# Patient Record
Sex: Female | Born: 1950 | Race: Black or African American | Hispanic: No | Marital: Single | Smoking: Never smoker
Health system: Southern US, Community
[De-identification: ages and names within clinical notes are randomized; demographics above are authoritative.]

## PROBLEM LIST (undated history)

## (undated) DIAGNOSIS — I10 Essential (primary) hypertension: Secondary | ICD-10-CM

## (undated) DIAGNOSIS — C419 Malignant neoplasm of bone and articular cartilage, unspecified: Secondary | ICD-10-CM

## (undated) DIAGNOSIS — E039 Hypothyroidism, unspecified: Secondary | ICD-10-CM

## (undated) HISTORY — PX: NASAL SINUS SURGERY: SHX719

## (undated) HISTORY — PX: ABOVE KNEE LEG AMPUTATION: SUR20

## (undated) HISTORY — DX: Essential (primary) hypertension: I10

## (undated) HISTORY — DX: Malignant neoplasm of bone and articular cartilage, unspecified: C41.9

## (undated) HISTORY — DX: Hypothyroidism, unspecified: E03.9

## (undated) HISTORY — PX: THYROIDECTOMY: SHX17

---

## 2009-07-22 ENCOUNTER — Emergency Department (HOSPITAL_COMMUNITY): Admission: EM | Admit: 2009-07-22 | Discharge: 2009-07-22 | Payer: Self-pay | Admitting: Emergency Medicine

## 2018-05-08 ENCOUNTER — Encounter: Payer: Self-pay | Admitting: "Endocrinology

## 2018-05-08 ENCOUNTER — Other Ambulatory Visit: Payer: Self-pay | Admitting: "Endocrinology

## 2018-05-08 ENCOUNTER — Ambulatory Visit (INDEPENDENT_AMBULATORY_CARE_PROVIDER_SITE_OTHER): Payer: Medicare Other | Admitting: "Endocrinology

## 2018-05-08 VITALS — BP 147/84 | HR 93

## 2018-05-08 DIAGNOSIS — Z85850 Personal history of malignant neoplasm of thyroid: Secondary | ICD-10-CM | POA: Diagnosis not present

## 2018-05-08 DIAGNOSIS — E890 Postprocedural hypothyroidism: Secondary | ICD-10-CM

## 2018-05-08 NOTE — Progress Notes (Signed)
Endocrinology Consult Note                                            05/08/2018, 5:56 PM   Subjective:    Patient ID: Teresa Mcgrath, female    DOB: May 13, 1951, PCP Teresa Mcgrath, Teresa Quarry, MD   Past Medical History:  Diagnosis Date  . Bone cancer ([place])   . Hypertension   . Hypothyroidism    Past Surgical History:  Procedure Laterality Date  . ABOVE KNEE LEG AMPUTATION    . NASAL SINUS SURGERY    . THYROIDECTOMY     Social History   Socioeconomic History  . Marital status: Single    Spouse name: Not on file  . Number of children: Not on file  . Years of education: Not on file  . Highest education level: Not on file  Occupational History  . Not on file  Social Needs  . Financial resource strain: Not on file  . Food insecurity:    Worry: Not on file    Inability: Not on file  . Transportation needs:    Medical: Not on file    Non-medical: Not on file  Tobacco Use  . Smoking status: Never Smoker  . Smokeless tobacco: Never Used  Substance and Sexual Activity  . Alcohol use: Never    Frequency: Never  . Drug use: Never  . Sexual activity: Not on file  Lifestyle  . Physical activity:    Days per week: Not on file    Minutes per session: Not on file  . Stress: Not on file  Relationships  . Social connections:    Talks on phone: Not on file    Gets together: Not on file    Attends religious service: Not on file    Active member of club or organization: Not on file    Attends meetings of clubs or organizations: Not on file    Relationship status: Not on file  Other Topics Concern  . Not on file  Social History Narrative  . Not on file   Outpatient Encounter Medications as of 05/08/2018  Medication Sig  . albuterol (VENTOLIN HFA) 108 (90 Base) MCG/ACT inhaler Inhale into the lungs every 6 (six) hours as needed.  . carboxymethylcellulose (REFRESH PLUS) 0.5 % SOLN 1 drop 2 (two) times daily as needed.  . Multiple Vitamin (MULTIVITAMIN WITH MINERALS) TABS  tablet Take 1 tablet by mouth daily.  . ranitidine (ZANTAC) 150 MG tablet Take 150 mg by mouth at bedtime.  Marland Kitchen amLODipine (NORVASC) 10 MG tablet Take 10 mg by mouth daily.  . Azelastine HCl 0.15 % SOLN Place 2 sprays into both nostrils daily.  . hydrALAZINE (APRESOLINE) 10 MG tablet 3 (three) times daily before meals.  Marland Kitchen KLOR-CON M20 20 MEQ tablet Take 20 mEq by mouth 2 (two) times daily.  Marland Kitchen levothyroxine (SYNTHROID, LEVOTHROID) 112 MCG tablet daily.  Marland Kitchen MUCINEX 600 MG 12 hr tablet as needed.  Marland Kitchen telmisartan-hydrochlorothiazide (MICARDIS HCT) 80-25 MG tablet daily.   No facility-administered encounter medications on file as of 05/08/2018.    ALLERGIES: Allergies  Allergen Reactions  . Amoxicillin-Pot Clavulanate Diarrhea and Swelling    abd pain  . Other Hives and Swelling    Beta blockers Fever/aching  . Montelukast Other (See Comments)    "chest feels raw"  VACCINATION STATUS:  There is no immunization history on file for this patient.  HPI [name] is 67 y.o. female who presents today with a medical history as above. she is being seen in consultation for postsurgical hypothyroidism and history of thyroid cancer requested by Robley Fries, MD.  -According to her report, she was diagnosed with thyroid malignancy in 2015 which required total thyroidectomy.  She does not recall if she has received I-131 thyroid remnant ablation. -The details of her medical records are not available to review today. -She was given various dose of levothyroxine over the years, currently on 112 mcg p.o. every morning.  She reports compliance.  She does not have recent thyroid function tests, TSH from October 2018 was 2.3.  She does not recall if she had subsequent surveillance thyroid/neck ultrasound.  -She denies cold/heat intolerance.  She is progressively gaining weight.  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 -She denies any family history of thyroid malignancy. -She denies dysphagia, odynophagia, nor voice change recently.  Review of Systems  Constitutional: + Recent weight gain,  no fatigue, no subjective hyperthermia, no subjective hypothermia Eyes: no blurry vision, no xerophthalmia ENT: no sore throat, no nodules palpated in throat, no dysphagia/odynophagia, no hoarseness Cardiovascular: no Chest Pain, no Shortness of Breath, no palpitations, no leg swelling Respiratory: no cough, no SOB Gastrointestinal: no Nausea/Vomiting/Diarhhea Musculoskeletal: + Wheelchair-bound, due to left lower extremity amputation for bone malignancy in the remote past.    Skin: no rashes Neurological: no tremors, no numbness, no tingling, no dizziness Psychiatric: no depression, no anxiety  Objective:    BP (!) 147/84   Pulse 93   Wt Readings from Last 3 Encounters:  No data found for Wt    Physical Exam  Constitutional: Sitting her wheelchair, not in acute distress, normal state of mind Eyes: PERRLA, EOMI, no exophthalmos ENT: moist mucous membranes, thyroidectomy scar on anterior lower neck,  no cervical lymphadenopathy Cardiovascular: normal precordial activity, Regular Rate and Rhythm, no Murmur/Rubs/Gallops Respiratory:  adequate breathing efforts, no gross chest deformity, Clear to auscultation bilaterally Gastrointestinal: abdomen soft, Non -tender, No distension, Bowel Sounds present Musculoskeletal: no gross deformities, strength intact in all four extremities Skin: moist, warm, no rashes Neurological: no tremor with outstretched hands, Deep tendon reflexes normal in all four extremities.  -No recent thyroid function test, no recent thyroid/neck sonogram.    Assessment & Plan:   1. History of thyroid cancer 2. Postsurgical hypothyroidism  - Teresa Mcgrath  is being seen at a kind request of Jaff, Teresa Quarry, MD. -Her  previous thyroid cancer diagnosis and treatment records are not available to review today.  -Regarding her postsurgical hypothyroidism, I will send her to lab for a new set of thyroid function tests.  In the meantime, I advised her to continue levothyroxine 112 mcg p.o. every morning.  - We discussed about correct intake of levothyroxine, at fasting, with water, separated by at least 30 minutes from breakfast, and separated by more than 4 hours from calcium, iron, multivitamins, acid reflux medications (PPIs). -Patient is made aware of the fact that thyroid hormone replacement is needed for life, dose to be adjusted by periodic monitoring of thyroid function tests.  -Regarding her history of thyroid malignancy, appears that she did not receive adjuvant thyroid remnant ablation.  She is asked to obtain thyroid/neck  ultrasound to assess the thyroid bed more completely.  She prefers to obtain her imaging study at the local facility in [place]. -Next course of action will depend on ultrasound findings.    - I did not initiate any new prescriptions today.  - I advised her  to maintain close follow up with Jaff, Teresa Quarry, MD for primary care needs.  - Time spent with the patient: 45 minutes, of which >50% was spent in obtaining information about her symptoms, reviewing her previous labs, evaluations, and treatments, counseling her about her postsurgical hypothyroidism, history of thyroid malignancy, and developing a plan to confirm the diagnosis and long term treatment as necessary.  Teresa Mcgrath participated in the discussions, expressed understanding, and voiced agreement with the above plans.  All questions were answered to her satisfaction. she is encouraged to contact clinic should she have any questions or concerns prior to her return visit.  Follow up plan: Return in about 3 weeks (around 05/29/2018) for labs today, Thyroid / Neck Ultrasound.   Glade Lloyd, MD [facility]  [facility] [address] [place], [place] [address] Phone: [phone]  Fax: [phone]     05/08/2018, 5:56 PM  This note was partially dictated with voice recognition software. Similar sounding words can be transcribed inadequately or may not  be corrected upon review.

## 2018-05-09 LAB — T3: T3, Total: 100 ng/dL (ref 71–180)

## 2018-05-09 LAB — TSH: TSH: 0.074 u[IU]/mL — ABNORMAL LOW (ref 0.450–4.500)

## 2018-05-09 LAB — T4, FREE: Free T4: 1.61 ng/dL (ref 0.82–1.77)

## 2018-05-22 ENCOUNTER — Ambulatory Visit (HOSPITAL_COMMUNITY): Admission: RE | Admit: 2018-05-22 | Payer: Medicare Other | Source: Ambulatory Visit

## 2018-05-23 ENCOUNTER — Ambulatory Visit (HOSPITAL_COMMUNITY)
Admission: RE | Admit: 2018-05-23 | Discharge: 2018-05-23 | Disposition: A | Discharge status: Home / Self Care | Payer: Medicare Other | Source: Ambulatory Visit | Attending: "Endocrinology | Admitting: "Endocrinology

## 2018-05-23 DIAGNOSIS — E890 Postprocedural hypothyroidism: Secondary | ICD-10-CM | POA: Insufficient documentation

## 2018-06-01 ENCOUNTER — Encounter: Payer: Self-pay | Admitting: "Endocrinology

## 2018-06-01 ENCOUNTER — Ambulatory Visit (INDEPENDENT_AMBULATORY_CARE_PROVIDER_SITE_OTHER): Payer: Medicare Other | Admitting: "Endocrinology

## 2018-06-01 VITALS — BP 144/82 | HR 80

## 2018-06-01 DIAGNOSIS — Z85850 Personal history of malignant neoplasm of thyroid: Secondary | ICD-10-CM | POA: Diagnosis not present

## 2018-06-01 DIAGNOSIS — E890 Postprocedural hypothyroidism: Secondary | ICD-10-CM

## 2018-06-01 MED ORDER — LEVOTHYROXINE SODIUM 112 MCG PO TABS: 112.0000 ug | ORAL_TABLET | Freq: Every day | ORAL | 2 refills | Status: DC

## 2018-06-01 NOTE — Progress Notes (Signed)
Endocrinology follow-up note                                            06/01/2018, 5:16 PM   Subjective:    Patient ID: Teresa Mcgrath, female    DOB: 02-Oct-1951, PCP Teresa Mcgrath, Teresa Quarry, MD   Past Medical History:  Diagnosis Date  . Bone cancer ([place])   . Hypertension   . Hypothyroidism    Past Surgical History:  Procedure Laterality Date  . ABOVE KNEE LEG AMPUTATION    . NASAL SINUS SURGERY    . THYROIDECTOMY     Social History   Socioeconomic History  . Marital status: Single    Spouse name: Not on file  . Number of children: Not on file  . Years of education: Not on file  . Highest education level: Not on file  Occupational History  . Not on file  Social Needs  . Financial resource strain: Not on file  . Food insecurity:    Worry: Not on file    Inability: Not on file  . Transportation needs:    Medical: Not on file    Non-medical: Not on file  Tobacco Use  . Smoking status: Never Smoker  . Smokeless tobacco: Never Used  Substance and Sexual Activity  . Alcohol use: Never    Frequency: Never  . Drug use: Never  . Sexual activity: Not on file  Lifestyle  . Physical activity:    Days per week: Not on file    Minutes per session: Not on file  . Stress: Not on file  Relationships  . Social connections:    Talks on phone: Not on file    Gets together: Not on file    Attends religious service: Not on file    Active member of club or organization: Not on file    Attends meetings of clubs or organizations: Not on file    Relationship status: Not on file  Other Topics Concern  . Not on file  Social History Narrative  . Not on file   Outpatient Encounter Medications as of 06/01/2018  Medication Sig  . albuterol (VENTOLIN HFA) 108 (90 Base) MCG/ACT inhaler Inhale into the lungs every 6 (six) hours as needed.  Marland Kitchen amLODipine (NORVASC) 10 MG tablet Take 10 mg by mouth daily.  . Azelastine HCl 0.15 % SOLN Place 2 sprays into both nostrils daily.  .  carboxymethylcellulose (REFRESH PLUS) 0.5 % SOLN 1 drop 2 (two) times daily as needed.  . hydrALAZINE (APRESOLINE) 10 MG tablet 3 (three) times daily before meals.  Marland Kitchen KLOR-CON M20 20 MEQ tablet Take 20 mEq by mouth 2 (two) times daily.  Marland Kitchen levothyroxine (SYNTHROID, LEVOTHROID) 112 MCG tablet Take 1 tablet (112 mcg total) by mouth daily before breakfast.  . MUCINEX 600 MG 12 hr tablet as needed.  . Multiple Vitamin (MULTIVITAMIN WITH MINERALS) TABS tablet Take 1 tablet by mouth daily.  . ranitidine (ZANTAC) 150 MG tablet Take 150 mg by mouth at bedtime.  Marland Kitchen telmisartan-hydrochlorothiazide (MICARDIS HCT) 80-25 MG tablet daily.  . [DISCONTINUED] levothyroxine (SYNTHROID, LEVOTHROID) 112 MCG tablet daily.   No facility-administered encounter medications on file as of 06/01/2018.    ALLERGIES: Allergies  Allergen Reactions  . Amoxicillin-Pot Clavulanate Diarrhea and Swelling    abd pain  . Other Hives and Swelling  Beta blockers Fever/aching  . Montelukast Other (See Comments)    "chest feels raw"    VACCINATION STATUS:  There is no immunization history on file for this patient.  HPI Teresa Mcgrath is 67 y.o. female who presents today with a medical history as above. she is being seen in follow-up with thyroid neck ultrasound and thyroid function test after she was seen in consultation for postsurgical hypothyroidism and history of thyroid cancer requested by Teresa Fries, MD.  -According to her report, she was diagnosed with thyroid malignancy in 2015 which required total thyroidectomy.  She does not recall if she has received I-131 thyroid remnant ablation. -The details of her medical records are not available to review today. -She was given various dose of levothyroxine over the years, currently on 112 mcg p.o. every morning.  She reports compliance.   -Her previsit thyroid function tests show appropriately suppressed TSH and free T4 of 1.61.    -She denies cold/heat intolerance.   She is progressively gaining weight, studies since last visit.  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 -She denies any family history of thyroid malignancy. -She denies dysphagia, odynophagia, nor voice change recently.  Review of Systems  Constitutional: + Steady weight,   no fatigue, no subjective hyperthermia, no subjective hypothermia Eyes: no blurry vision, no xerophthalmia ENT: no sore throat, no nodules palpated in throat, no dysphagia/odynophagia, no hoarseness Cardiovascular: no Chest Pain, no Shortness of Breath, no palpitations, no leg swelling Respiratory: no cough, no SOB Gastrointestinal: no Nausea/Vomiting/Diarhhea Musculoskeletal: + Wheelchair-bound, due to left lower extremity amputation for bone malignancy in the remote past.    Skin: no rashes Neurological: no tremors, no numbness, no tingling, no dizziness Psychiatric: no depression, no anxiety  Objective:    BP (!) 144/82   Pulse 80   Wt Readings from Last 3 Encounters:  No data found for Wt    Physical Exam  Constitutional: Sitting in her wheelchair, not in acute distress, normal state of mind Eyes: PERRLA, EOMI, no exophthalmos ENT: moist mucous membranes, thyroidectomy scar on anterior lower neck,  no cervical lymphadenopathy Cardiovascular: normal precordial activity, Regular Rate and Rhythm, no Murmur/Rubs/Gallops Respiratory:  adequate breathing efforts, no gross chest deformity, Clear to auscultation bilaterally Gastrointestinal: abdomen soft, Non -tender, No distension, Bowel Sounds present Musculoskeletal:  + Wheelchair-bound, due to left lower extremity amputation for bone malignancy in the remote past.   Skin: moist, warm, no rashes Neurological: no tremor with outstretched hands  Recent Results (from the past 2160 hour(s))  T4, free     Status: None    Collection Time: 05/08/18  3:16 PM  Result Value Ref Range   Free T4 1.61 0.82 - 1.77 ng/dL  TSH     Status: Abnormal   Collection Time: 05/08/18  3:16 PM  Result Value Ref Range   TSH 0.074 (L) 0.450 - 4.500 uIU/mL  T3     Status: None   Collection Time: 05/08/18  3:16 PM  Result Value Ref Range   T3, Total 100 71 - 180 ng/dL   Thyroid/neck ultrasound from May 23, 2018 reveals surgical changes of prior total thyroidectomy without evidence of residual thyroid tissue, nodules or lymphadenopathy.  Assessment & Plan:   1. History of thyroid cancer 2. Postsurgical hypothyroidism  -Her previous thyroid cancer diagnosis and treatment records are not available  to review today.  Her thyroid function tests are consistent with appropriate suppression of TSH at 0.07, free T4 1.61. -She is advised to continue on her current levothyroxine dose of 112 mcg p.o. daily before breakfast.   - We discussed about correct intake of levothyroxine, at fasting, with water, separated by at least 30 minutes from breakfast, and separated by more than 4 hours from calcium, iron, multivitamins, acid reflux medications (PPIs). -Patient is made aware of the fact that thyroid hormone replacement is needed for life, dose to be adjusted by periodic monitoring of thyroid function tests.   -Regarding her history of thyroid malignancy, appears that she did not receive adjuvant thyroid remnant ablation.  -Her surveillance thyroid/neck ultrasound reveals no residual tissue, no lymphadenopathy.  -She would not be considered for any additional treatment at this time.  She will be considered for unstimulated thyroglobulin and thyroglobulin antibodies during her next previsit labs. -If she would have detectable thyroglobulin, she will be considered for Thyrogen stimulated whole-body scan subsequent to her next visit.   - I advised her  to maintain close follow up with Jaff, Teresa Quarry, MD for primary care needs.  - Time spent  with the patient: 25 min, of which >50% was spent in reviewing her  current and  previous labs, previous treatments, and medications doses and developing a plan for long-term care.  Teresa Mcgrath participated in the discussions, expressed understanding, and voiced agreement with the above plans.  All questions were answered to her satisfaction. she is encouraged to contact clinic should she have any questions or concerns prior to her return visit.   Follow up plan: Return in about 6 months (around 12/02/2018) for follow up with pre-visit labs.   Glade Lloyd, MD [facility] [address] [place], [place] [address] Phone: [phone]  Fax: [phone]     06/01/2018, 5:16 PM  This note was partially dictated with voice recognition software. Similar sounding words can be transcribed inadequately or may not  be corrected upon review.

## 2018-11-27 ENCOUNTER — Other Ambulatory Visit: Payer: Self-pay | Admitting: "Endocrinology

## 2018-11-28 LAB — T4, FREE: FREE T4: 1.96 ng/dL — AB (ref 0.82–1.77)

## 2018-11-28 LAB — TSH: TSH: 0.012 u[IU]/mL — AB (ref 0.450–4.500)

## 2018-11-28 LAB — THYROGLOBULIN ANTIBODY: Thyroglobulin Antibody: <1 IU/mL (ref 0.0–0.9)

## 2018-12-05 ENCOUNTER — Ambulatory Visit: Payer: Medicare Other | Admitting: "Endocrinology

## 2018-12-22 ENCOUNTER — Encounter: Payer: Self-pay | Admitting: "Endocrinology

## 2018-12-22 ENCOUNTER — Ambulatory Visit (INDEPENDENT_AMBULATORY_CARE_PROVIDER_SITE_OTHER): Payer: Medicare Other | Admitting: "Endocrinology

## 2018-12-22 VITALS — BP 135/86 | HR 80 | Wt 181.0 lb

## 2018-12-22 DIAGNOSIS — Z85850 Personal history of malignant neoplasm of thyroid: Secondary | ICD-10-CM | POA: Diagnosis not present

## 2018-12-22 DIAGNOSIS — E890 Postprocedural hypothyroidism: Secondary | ICD-10-CM | POA: Diagnosis not present

## 2018-12-22 MED ORDER — LEVOTHYROXINE SODIUM 100 MCG PO TABS: 100.0000 ug | ORAL_TABLET | Freq: Every day | ORAL | 1 refills | Status: DC

## 2018-12-22 NOTE — Progress Notes (Signed)
Endocrinology follow-up note                                            12/22/2018, 10:20 AM   Subjective:    Patient ID: Teresa Mcgrath, female    DOB: 20-Feb-1951, PCP Jodean Lima, Salli Quarry, MD   Past Medical History:  Diagnosis Date  . Bone cancer ([place])   . Hypertension   . Hypothyroidism    Past Surgical History:  Procedure Laterality Date  . ABOVE KNEE LEG AMPUTATION    . NASAL SINUS SURGERY    . THYROIDECTOMY     Social History   Socioeconomic History  . Marital status: Single    Spouse name: Not on file  . Number of children: Not on file  . Years of education: Not on file  . Highest education level: Not on file  Occupational History  . Not on file  Social Needs  . Financial resource strain: Not on file  . Food insecurity:    Worry: Not on file    Inability: Not on file  . Transportation needs:    Medical: Not on file    Non-medical: Not on file  Tobacco Use  . Smoking status: Never Smoker  . Smokeless tobacco: Never Used  Substance and Sexual Activity  . Alcohol use: Never    Frequency: Never  . Drug use: Never  . Sexual activity: Not on file  Lifestyle  . Physical activity:    Days per week: Not on file    Minutes per session: Not on file  . Stress: Not on file  Relationships  . Social connections:    Talks on phone: Not on file    Gets together: Not on file    Attends religious service: Not on file    Active member of club or organization: Not on file    Attends meetings of clubs or organizations: Not on file    Relationship status: Not on file  Other Topics Concern  . Not on file  Social History Narrative  . Not on file   Outpatient Encounter Medications as of 12/22/2018  Medication Sig  . fluticasone (FLOVENT DISKUS) 50 MCG/BLIST diskus inhaler Inhale 1 puff into the lungs daily.  Marland Kitchen albuterol (VENTOLIN HFA) 108 (90 Base) MCG/ACT inhaler Inhale into the lungs every 6 (six) hours as needed.  Marland Kitchen amLODipine (NORVASC) 10 MG tablet Take 10 mg  by mouth daily.  . carboxymethylcellulose (REFRESH PLUS) 0.5 % SOLN 1 drop 2 (two) times daily as needed.  . hydrALAZINE (APRESOLINE) 10 MG tablet 3 (three) times daily before meals.  Marland Kitchen KLOR-CON M20 20 MEQ tablet Take 20 mEq by mouth 2 (two) times daily.  Marland Kitchen levothyroxine (SYNTHROID, LEVOTHROID) 100 MCG tablet Take 1 tablet (100 mcg total) by mouth daily before breakfast.  . MUCINEX 600 MG 12 hr tablet as needed.  Marland Kitchen telmisartan-hydrochlorothiazide (MICARDIS HCT) 80-25 MG tablet daily.  . [DISCONTINUED] Azelastine HCl 0.15 % SOLN Place 2 sprays into both nostrils daily.  . [DISCONTINUED] levothyroxine (SYNTHROID, LEVOTHROID) 112 MCG tablet Take 1 tablet (112 mcg total) by mouth daily before breakfast.  . [DISCONTINUED] Multiple Vitamin (MULTIVITAMIN WITH MINERALS) TABS tablet Take 1 tablet by mouth daily.  . [DISCONTINUED] ranitidine (ZANTAC) 150 MG tablet Take 150 mg by mouth at bedtime.   No facility-administered encounter medications on file as  of 12/22/2018.    ALLERGIES: Allergies  Allergen Reactions  . Amoxicillin-Pot Clavulanate Diarrhea and Swelling    abd pain  . Other Hives and Swelling    Beta blockers Fever/aching  . Montelukast Other (See Comments)    "chest feels raw"    VACCINATION STATUS:  There is no immunization history on file for this patient.  HPI Teresa Mcgrath is 68 y.o. female who presents today with a medical history as above. 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  Prior to her last visit, thyroid/ neck ultrasound was negative for any residual tissue nor lymphadenopathy.    -According to her report, she was diagnosed with thyroid malignancy in 2015 which required total thyroidectomy.  She does not recall if she has received I-131 thyroid remnant ablation. -The details of her medical records  are not available to review today. -She was given various dose of levothyroxine over the years, currently on 112 mcg p.o. every morning.  She reports compliance.   -Her previsit thyroid function tests show slight over replacement.     -She denies cold/heat intolerance.  She is progressively gaining weight, studies since last visit.  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 -She denies any family history of thyroid malignancy. -She denies dysphagia, odynophagia, nor voice change recently.  Review of Systems  Constitutional: + Steady weight,   no fatigue, no subjective hyperthermia, no subjective hypothermia Eyes: no blurry vision, no xerophthalmia ENT: no sore throat, no nodules palpated in throat, no dysphagia/odynophagia, no hoarseness Cardiovascular: no Chest Pain, no Shortness of Breath, no palpitations, no leg swelling Respiratory: no cough, no SOB Gastrointestinal: no Nausea/Vomiting/Diarhhea Musculoskeletal: + Wheelchair-bound, due to left lower extremity amputation for bone malignancy in the remote past.    Skin: no rashes Neurological: no tremors, no numbness, no tingling, no dizziness Psychiatric: no depression, no anxiety  Objective:    BP 135/86   Pulse 80   Wt 181 lb (82.1 kg)   Wt Readings from Last 3 Encounters:  12/22/18 181 lb (82.1 kg)    Physical Exam  Constitutional: Sitting in her wheelchair, not in acute distress, normal state of mind Eyes: PERRLA, EOMI, no exophthalmos ENT: moist mucous membranes, thyroidectomy scar on anterior lower neck,  no cervical lymphadenopathy Musculoskeletal:  + Wheelchair-bound, due to left lower extremity amputation for bone malignancy in the remote past.   Skin: moist, warm, no rashes Neurological: no tremor with outstretched hands  Recent Results (from the past 2160 hour(s))  T4,  free     Status: Abnormal   Collection Time: 11/27/18  1:52 PM  Result Value Ref Range   Free T4 1.96 (H) 0.82 - 1.77 ng/dL  TSH     Status: Abnormal   Collection Time: 11/27/18  1:52 PM  Result Value Ref Range   TSH 0.012 (L) 0.450 - 4.500 uIU/mL  Thyroglobulin antibody     Status: None   Collection Time: 11/27/18  1:52 PM  Result Value Ref Range   Thyroglobulin Antibody <1.0 0.0 - 0.9 IU/mL    Comment: Thyroglobulin Antibody measured by [facility] Method[employer]   Thyroid/neck ultrasound from May 23, 2018 reveals surgical changes of prior total thyroidectomy without evidence of residual thyroid tissue,  nodules or lymphadenopathy.  Assessment & Plan:   1. History of thyroid cancer 2. Postsurgical hypothyroidism  -Her previous thyroid cancer diagnosis and treatment records are not available to review today.  Her thyroid function tests are consistent with over replacement beyond the required the required suppression of TSH.    -She is approached for a lower dose of levothyroxine.  I discussed and lowered her levothyroxine to 100 mcg p.o. every morning.   - We discussed about the correct intake of her thyroid hormone, on empty stomach at fasting, with water, separated by at least 30 minutes from breakfast and other medications,  and separated by more than 4 hours from calcium, iron, multivitamins, acid reflux medications (PPIs). -Patient is made aware of the fact that thyroid hormone replacement is needed for life, dose to be adjusted by periodic monitoring of thyroid function tests.    -Regarding her history of thyroid malignancy, appears that she did not receive adjuvant thyroid remnant ablation.  -Her surveillance thyroid/neck ultrasound reveals no residual tissue, no lymphadenopathy.  -She would not be considered for any additional treatment at this time.  She will be considered for unstimulated thyroglobulin and thyroglobulin antibodies during her next previsit labs. -If she  would have detectable thyroglobulin, she will be considered for Thyrogen stimulated whole-body scan subsequent to her next visit.   - I advised her  to maintain close follow up with Jaff, Salli Quarry, MD for primary care needs.   Follow up plan: No follow-ups on file.   Glade Lloyd, MD [facility] [address] [place], [place] [address] Phone: [phone]  Fax: [phone]     12/22/2018, 10:20 AM  This note was partially dictated with voice recognition software. Similar sounding words can be transcribed inadequately or may not  be corrected upon review.

## 2019-01-15 ENCOUNTER — Other Ambulatory Visit: Payer: Self-pay | Admitting: "Endocrinology

## 2019-01-15 ENCOUNTER — Telehealth: Payer: Self-pay | Admitting: "Endocrinology

## 2019-01-15 MED ORDER — LEVOTHYROXINE SODIUM 88 MCG PO TABS: 88.0000 ug | ORAL_TABLET | Freq: Every day | ORAL | 2 refills | Status: DC

## 2019-01-15 NOTE — Telephone Encounter (Signed)
Please advise 

## 2019-01-15 NOTE — Telephone Encounter (Signed)
Pt LVM that the levothyroxine (SYNTHROID, LEVOTHROID) 100 MCG tablet is causing her to be jittery\headache\itching  Please call [phone]

## 2019-01-15 NOTE — Telephone Encounter (Signed)
I will lower her dose to 88 mcg , please inform her to pick up from pharmacy. Thank you.

## 2019-01-16 NOTE — Telephone Encounter (Signed)
Spoke with patient and advised her of dose change to medication with verbal understanding.

## 2019-01-16 NOTE — Telephone Encounter (Signed)
Called patient to let her know her med will be decreased. No answer and unable to leave vm. Will try again later.

## 2019-02-25 ENCOUNTER — Other Ambulatory Visit: Payer: Self-pay | Admitting: "Endocrinology

## 2019-02-26 NOTE — Telephone Encounter (Signed)
Which dose of levothyroxine should pt be on?

## 2019-03-05 IMAGING — US US THYROID
1 series · 14 of 19 positions shown · non-contrast
Comparison: None.

CLINICAL DATA: Other. 66-year-old female status post total
thyroidectomy.

EXAM:
THYROID ULTRASOUND
TECHNIQUE: Ultrasound examination of the thyroid gland and adjacent soft
tissues was performed.

[Series 1: us thyroid · 0.07mm/px · 14 of 19 slices shown]
[im 1/19]
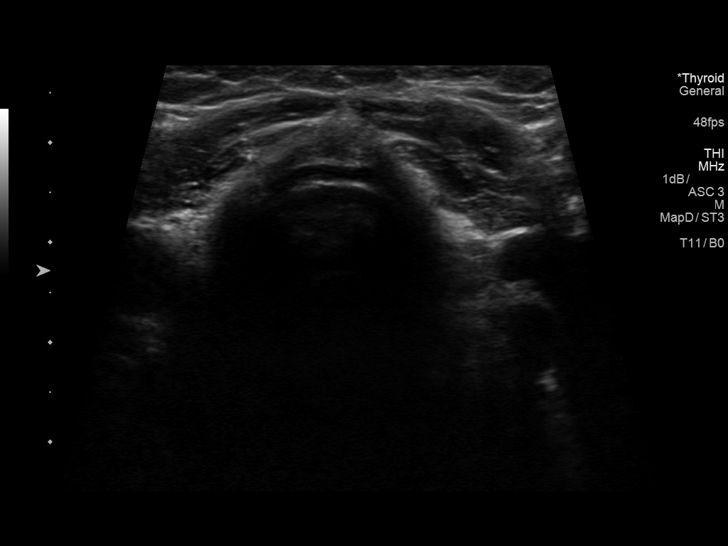
[im 3/19]
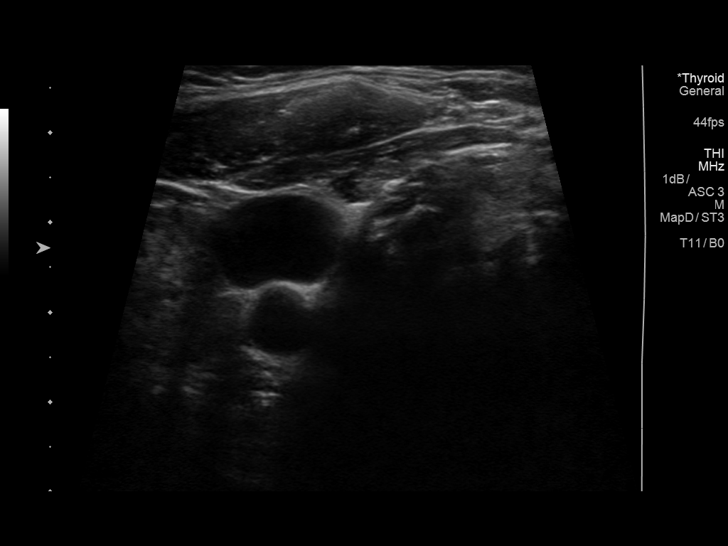
[im 4/19]
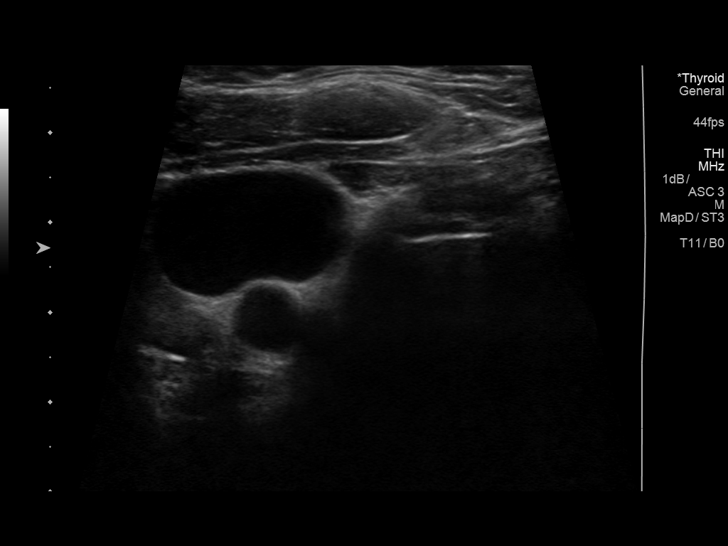
[im 5/19]
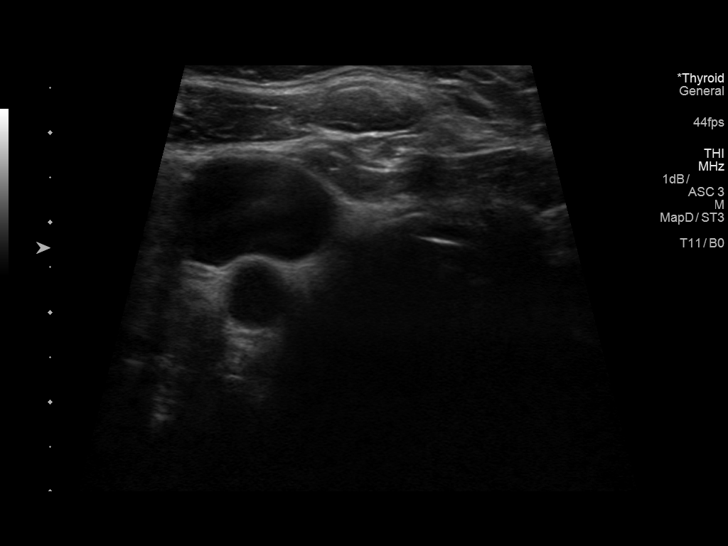
[im 7/19]
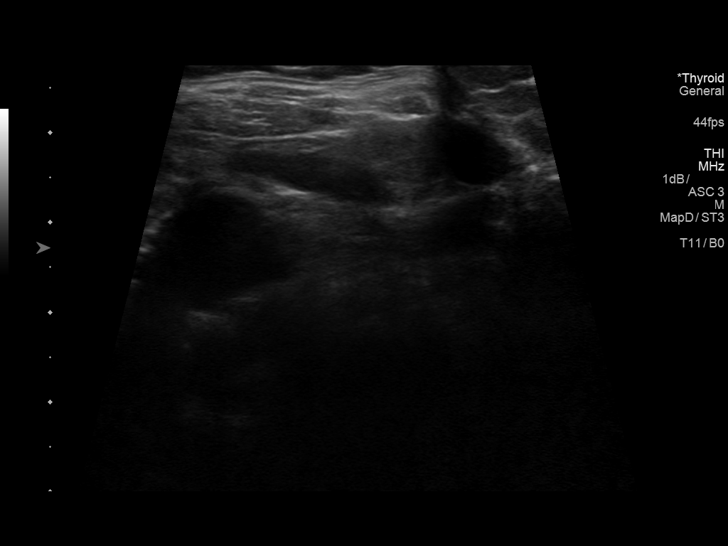
[im 8/19]
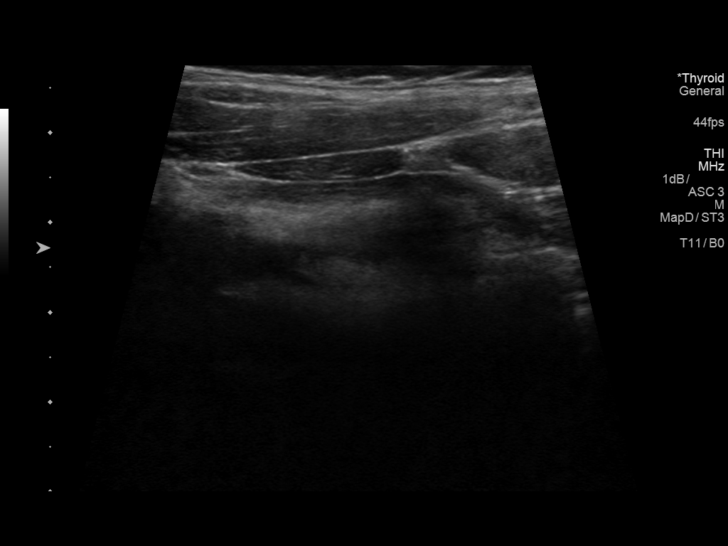
[im 9/19]
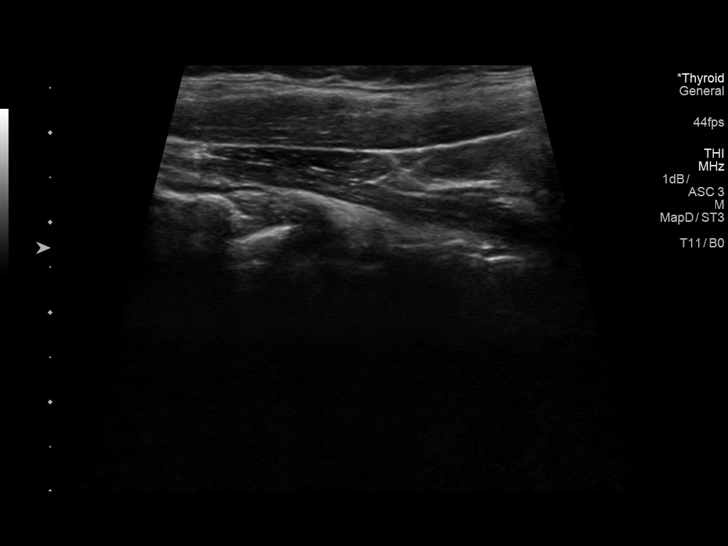
[im 11/19]
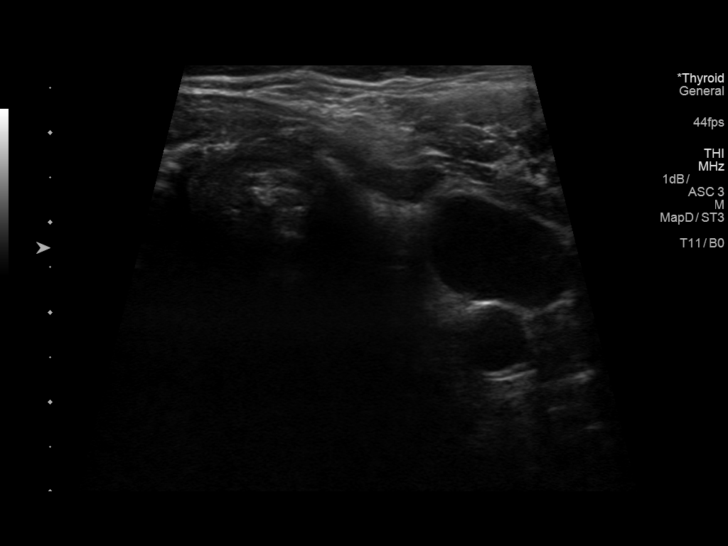
[im 12/19]
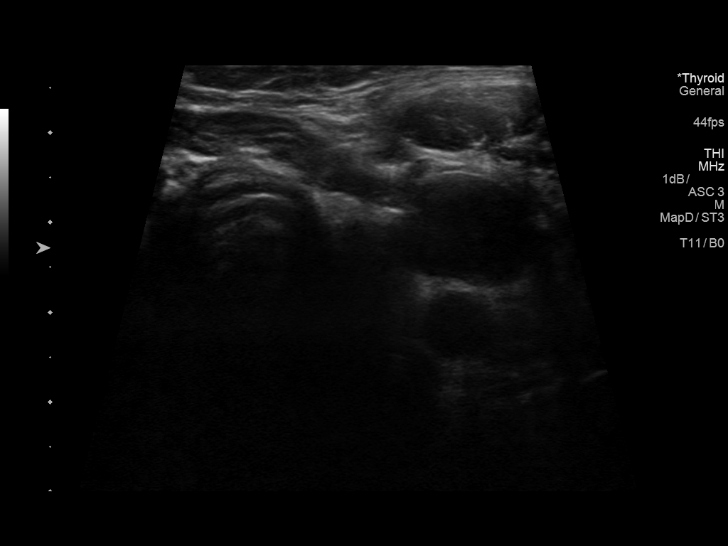
[im 13/19]
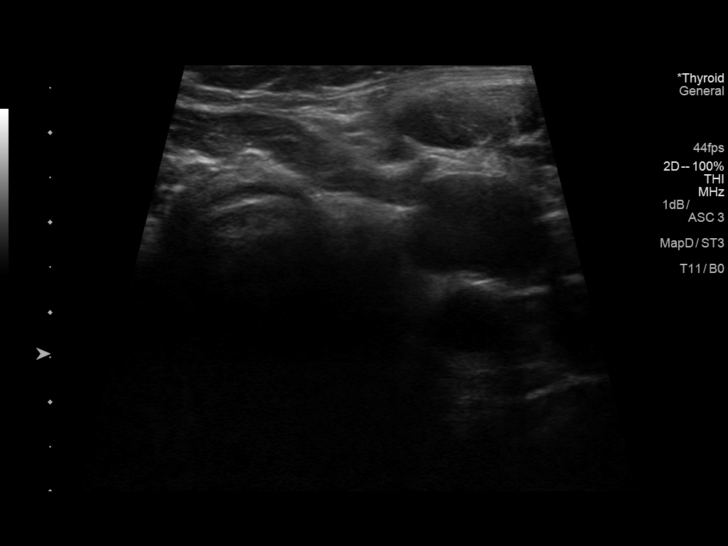
[im 15/19]
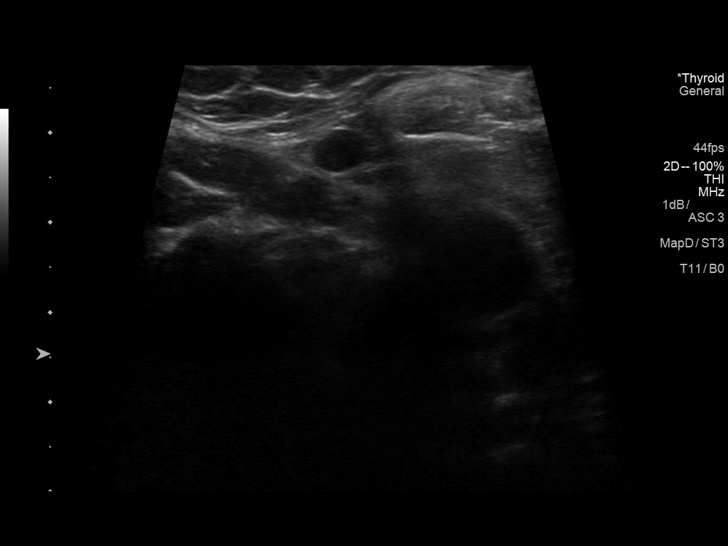
[im 16/19]
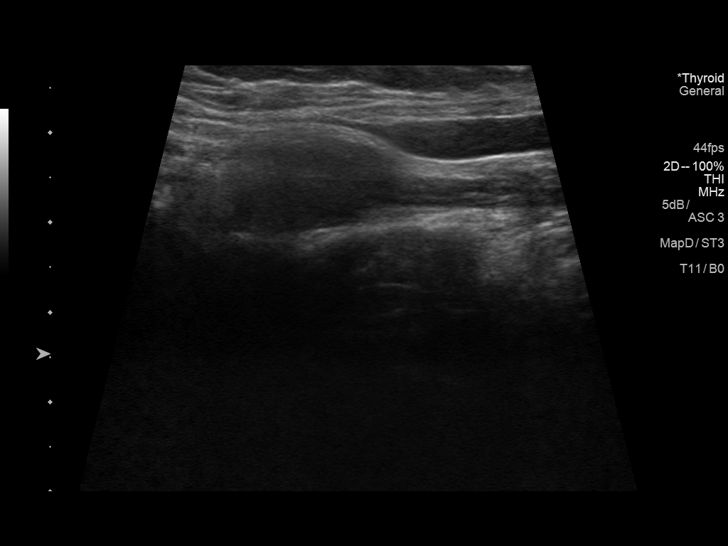
[im 17/19]
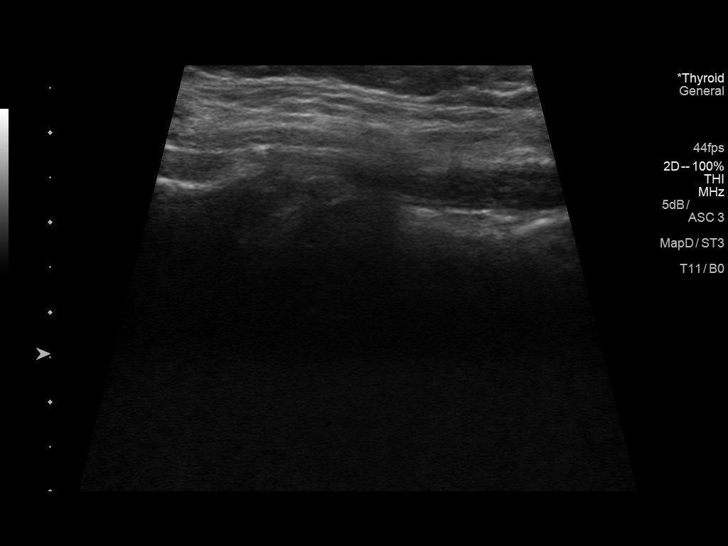
[im 19/19]
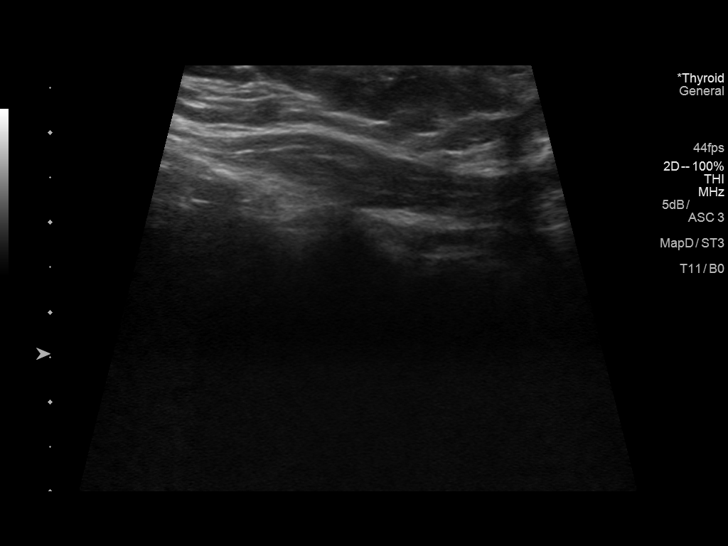

[14 of 19 positions shown; findings below may reference images not displayed]

FINDINGS: The thyroid gland is surgically absent. No evidence of residual
thyroid tissue in the resection bed. No nodularity or
lymphadenopathy. Normal sonographic appearance of the strap muscles,
subcutaneous soft tissues and limited evaluation of the bilateral
carotid arteries and jugular veins.
IMPRESSION: Surgical changes of prior total thyroidectomy without evidence of
residual thyroid tissue, nodules or lymphadenopathy.

## 2019-04-06 ENCOUNTER — Other Ambulatory Visit: Payer: Self-pay | Admitting: "Endocrinology

## 2019-07-10 ENCOUNTER — Telehealth: Payer: Self-pay | Admitting: "Endocrinology

## 2019-07-10 DIAGNOSIS — Z85850 Personal history of malignant neoplasm of thyroid: Secondary | ICD-10-CM

## 2019-07-10 DIAGNOSIS — E890 Postprocedural hypothyroidism: Secondary | ICD-10-CM

## 2019-07-10 NOTE — Telephone Encounter (Signed)
Order faxed to [place] [facility]

## 2019-07-10 NOTE — Telephone Encounter (Signed)
Can you update the labs? She needs them faxed to [facility] in [place]. The one on [address] closed. So not sure which one we need to send it too?

## 2019-07-18 LAB — T4, FREE: Free T4: 1.46 ng/dL (ref 0.82–1.77)

## 2019-07-24 ENCOUNTER — Encounter: Payer: Self-pay | Admitting: "Endocrinology

## 2019-07-24 ENCOUNTER — Ambulatory Visit (INDEPENDENT_AMBULATORY_CARE_PROVIDER_SITE_OTHER): Payer: Medicare Other | Admitting: "Endocrinology

## 2019-07-24 ENCOUNTER — Other Ambulatory Visit: Payer: Self-pay

## 2019-07-24 DIAGNOSIS — E890 Postprocedural hypothyroidism: Secondary | ICD-10-CM

## 2019-07-24 DIAGNOSIS — Z85850 Personal history of malignant neoplasm of thyroid: Secondary | ICD-10-CM

## 2019-07-24 MED ORDER — LEVOTHYROXINE SODIUM 100 MCG PO TABS: 100.0000 ug | ORAL_TABLET | Freq: Every day | ORAL | 1 refills | Status: DC

## 2019-07-24 NOTE — Progress Notes (Signed)
07/24/2019, 11:54 AM                                Endocrinology Telehealth Visit Follow up Note -During COVID -19 Pandemic  I connected with Teresa Mcgrath on [id number]   by telephone and verified that I am speaking with the correct person using two identifiers. Teresa Mcgrath, [id number]. Teresa Mcgrath has verbally consented to this visit. All issues noted in this document were discussed and addressed. The format was not optimal for physical exam.   Subjective:    Patient ID: Teresa Mcgrath, female    DOB: [birth year], PCP Jodean Lima, Salli Quarry, MD   Past Medical History:  Diagnosis Date  . Bone cancer ([place])   . Hypertension   . Hypothyroidism    Past Surgical History:  Procedure Laterality Date  . ABOVE KNEE LEG AMPUTATION    . NASAL SINUS SURGERY    . THYROIDECTOMY     Social History   Socioeconomic History  . Marital status: Single    Spouse name: Not on file  . Number of children: Not on file  . Years of education: Not on file  . Highest education level: Not on file  Occupational History  . Not on file  Social Needs  . Financial resource strain: Not on file  . Food insecurity    Worry: Not on file    Inability: Not on file  . Transportation needs    Medical: Not on file    Non-medical: Not on file  Tobacco Use  . Smoking status: Never Smoker  . Smokeless tobacco: Never Used  Substance and Sexual Activity  . Alcohol use: Never    Frequency: Never  . Drug use: Never  . Sexual activity: Not on file  Lifestyle  . Physical activity    Days per week: Not on file    Minutes per session: Not on file  . Stress: Not on file  Relationships  . Social Herbalist on phone: Not on file    Gets together: Not on file    Attends religious service: Not on file    Active member of club or organization: Not on file    Attends meetings of clubs or organizations: Not on file    Relationship status: Not on file  Other  Topics Concern  . Not on file  Social History Narrative  . Not on file   Outpatient Encounter Medications as of 07/24/2019  Medication Sig  . albuterol (VENTOLIN HFA) 108 (90 Base) MCG/ACT inhaler Inhale into the lungs every 6 (six) hours as needed.  Marland Kitchen amLODipine (NORVASC) 10 MG tablet Take 10 mg by mouth daily.  . carboxymethylcellulose (REFRESH PLUS) 0.5 % SOLN 1 drop 2 (two) times daily as needed.  . fluticasone (FLOVENT DISKUS) 50 MCG/BLIST diskus inhaler Inhale 1 puff into the lungs daily.  . hydrALAZINE (APRESOLINE) 10 MG tablet 3 (three) times daily before meals.  Marland Kitchen KLOR-CON M20 20 MEQ tablet Take 20 mEq by mouth 2 (two) times daily.  Marland Kitchen levothyroxine (SYNTHROID) 100 MCG tablet Take 1 tablet (100 mcg total) by mouth daily before breakfast.  .  MUCINEX 600 MG 12 hr tablet as needed.  Marland Kitchen telmisartan-hydrochlorothiazide (MICARDIS HCT) 80-25 MG tablet daily.  . [DISCONTINUED] levothyroxine (SYNTHROID) 88 MCG tablet TAKE 1 TABLET (88 MCG TOTAL) BY MOUTH DAILY BEFORE BREAKFAST.  . [DISCONTINUED] levothyroxine (SYNTHROID, LEVOTHROID) 100 MCG tablet Take 1 tablet (100 mcg total) by mouth daily before breakfast.   No facility-administered encounter medications on file as of 07/24/2019.    ALLERGIES: Allergies  Allergen Reactions  . Amoxicillin-Pot Clavulanate Diarrhea and Swelling    abd pain  . Other Hives and Swelling    Beta blockers Fever/aching  . Montelukast Other (See Comments)    "chest feels raw"    VACCINATION STATUS:  There is no immunization history on file for this patient.  HPI Teresa Mcgrath is 68 y.o. female who presents today with a medical history as above. Teresa Mcgrath is being engaged in telehealth via telephone in follow-up for her postsurgical hypothyroidism.    Teresa Mcgrath was seen in consultation for  postsurgical hypothyroidism for history of thyroid malignancy treated in 2015 with total thyroidectomy.  Thyroid/neck ultrasound on July 2019 was negative for any residual  thyroid tissue no lymphadenopathy.     -According to her report, Teresa Mcgrath was diagnosed with thyroid malignancy in 2015 which required total thyroidectomy.  Teresa Mcgrath does not recall if Teresa Mcgrath has received I-131 thyroid remnant ablation. -The details of her medical records are not available to review today. -Teresa Mcgrath was given various dose of levothyroxine over the years, currently on 100 mcg p.o. every morning.  Teresa Mcgrath reports compliance.    -Teresa Mcgrath denies cold/heat intolerance.  Teresa Mcgrath is progressively gaining weight, studies since last visit.  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 -Teresa Mcgrath denies any family history of thyroid malignancy. -Teresa Mcgrath denies dysphagia, odynophagia, nor voice change recently.  Review of Systems Limited as above.  Objective:    There were no vitals taken for this visit.  Wt Readings from Last 3 Encounters:  12/22/18 181 lb (82.1 kg)    Physical Exam  Musculoskeletal:  + Wheelchair-bound, due to left lower extremity amputation for bone malignancy in the remote past.    Recent Results (from the past 2160 hour(s))  T4, Free     Status: None   Collection Time: 07/17/19 12:08 PM  Result Value Ref Range   Free T4 1.46 0.82 - 1.77 ng/dL   Thyroid/neck ultrasound from May 23, 2018 reveals surgical changes of prior total thyroidectomy without evidence of residual thyroid tissue, nodules or lymphadenopathy.  Assessment & Plan:   1. History of thyroid cancer 2. Postsurgical hypothyroidism  -Her previous thyroid cancer diagnosis and treatment records are not available to review today.  Her thyroid function tests are consistent with appropriate replacement with levothyroxine.  Her previsit labs were supposed to include thyroglobulin levels and thyroglobulin bodies, however this part of her tests were not performed.   Teresa Mcgrath is advised to continue  levothyroxine 100 mcg p.o. daily before breakfast.    - We discussed about the correct intake of her thyroid hormone, on empty stomach at fasting, with water, separated by at least 30 minutes from breakfast and other medications,  and separated by more than 4 hours from calcium, iron, multivitamins, acid reflux medications (PPIs). -Patient is made aware of the fact that thyroid hormone replacement is needed for life, dose to be adjusted by periodic monitoring of thyroid function  tests.    -Regarding her history of thyroid malignancy, appears that Teresa Mcgrath did not receive adjuvant thyroid remnant ablation.  -Her recent surveillance thyroid/neck ultrasound reveals no residual tissue, no lymphadenopathy.  -Teresa Mcgrath would not be considered for any additional treatment at this time.  Teresa Mcgrath will be considered for unstimulated thyroglobulin and thyroglobulin antibodies during her next previsit labs. -If Teresa Mcgrath is found to have detectable thyroglobulin, Teresa Mcgrath will be considered for Thyrogen stimulated whole-body scan subsequent to her next visit.   - I advised her  to maintain close follow up with Jaff, Salli Quarry, MD for primary care needs.   Time for this visit: 15 minutes. Teresa Mcgrath  participated in the discussions, expressed understanding, and voiced agreement with the above plans.  All questions were answered to her satisfaction. Teresa Mcgrath is encouraged to contact clinic should Teresa Mcgrath have any questions or concerns prior to her return visit.  Follow up plan: No follow-ups on file.   Glade Lloyd, MD [facility] [address] [place],  [address] Phone: [phone]  Fax: [phone]     07/24/2019, 11:54 AM  This note was partially dictated with voice recognition software. Similar sounding words can be transcribed inadequately or may not  be corrected upon review.

## 2020-01-18 ENCOUNTER — Encounter: Payer: Self-pay | Admitting: "Endocrinology

## 2020-01-18 LAB — TSH: TSH: 0.27 — AB (ref 0.41–5.90)

## 2020-01-22 ENCOUNTER — Telehealth: Payer: Self-pay | Admitting: "Endocrinology

## 2020-01-22 NOTE — Telephone Encounter (Signed)
We can see her.

## 2020-01-22 NOTE — Telephone Encounter (Signed)
Patient only did her free T4 and TSH, she did not do the other two. She is on schedule for tomorrow. Do you want me to keep her on or r/s her?

## 2020-01-23 ENCOUNTER — Ambulatory Visit: Payer: Medicare Other | Admitting: "Endocrinology

## 2020-01-25 ENCOUNTER — Ambulatory Visit (INDEPENDENT_AMBULATORY_CARE_PROVIDER_SITE_OTHER): Payer: Medicare Other | Admitting: "Endocrinology

## 2020-01-25 ENCOUNTER — Encounter: Payer: Self-pay | Admitting: "Endocrinology

## 2020-01-25 DIAGNOSIS — Z85850 Personal history of malignant neoplasm of thyroid: Secondary | ICD-10-CM | POA: Diagnosis not present

## 2020-01-25 DIAGNOSIS — E890 Postprocedural hypothyroidism: Secondary | ICD-10-CM

## 2020-01-25 NOTE — Progress Notes (Signed)
01/25/2020, 12:32 PM                                Endocrinology Telehealth Visit Follow up Note -During COVID -19 Pandemic  I connected with Teresa Mcgrath on [id number]   by telephone and verified that I am speaking with the correct person using two identifiers. Teresa Mcgrath, [id number]. she has verbally consented to this visit. All issues noted in this document were discussed and addressed. The format was not optimal for physical exam.   Subjective:    Patient ID: Teresa Mcgrath, female    DOB: [birth year], PCP Jodean Lima, Salli Quarry, MD   Past Medical History:  Diagnosis Date  . Bone cancer ([place])   . Hypertension   . Hypothyroidism    Past Surgical History:  Procedure Laterality Date  . ABOVE KNEE LEG AMPUTATION    . NASAL SINUS SURGERY    . THYROIDECTOMY     Social History   Socioeconomic History  . Marital status: Single    Spouse name: Not on file  . Number of children: Not on file  . Years of education: Not on file  . Highest education level: Not on file  Occupational History  . Not on file  Tobacco Use  . Smoking status: Never Smoker  . Smokeless tobacco: Never Used  Substance and Sexual Activity  . Alcohol use: Never  . Drug use: Never  . Sexual activity: Not on file  Other Topics Concern  . Not on file  Social History Narrative  . Not on file   Social Determinants of Health   Financial Resource Strain:   . Difficulty of Paying Living Expenses:   Food Insecurity:   . Worried About Charity fundraiser in the Last Year:   . Arboriculturist in the Last Year:   Transportation Needs:   . Film/video editor (Medical):   Marland Kitchen Lack of Transportation (Non-Medical):   Physical Activity:   . Days of Exercise per Week:   . Minutes of Exercise per Session:   Stress:   . Feeling of Stress :   Social Connections:   . Frequency of Communication with Friends and Family:   . Frequency of Social Gatherings  with Friends and Family:   . Attends Religious Services:   . Active Member of Clubs or Organizations:   . Attends Archivist Meetings:   Marland Kitchen Marital Status:    Outpatient Encounter Medications as of 01/25/2020  Medication Sig  . albuterol (VENTOLIN HFA) 108 (90 Base) MCG/ACT inhaler Inhale into the lungs every 6 (six) hours as needed.  Marland Kitchen amLODipine (NORVASC) 10 MG tablet Take 10 mg by mouth daily.  . carboxymethylcellulose (REFRESH PLUS) 0.5 % SOLN 1 drop 2 (two) times daily as needed.  . fluticasone (FLOVENT DISKUS) 50 MCG/BLIST diskus inhaler Inhale 1 puff into the lungs daily.  . hydrALAZINE (APRESOLINE) 10 MG tablet 3 (three) times daily before meals.  Marland Kitchen KLOR-CON M20 20 MEQ tablet Take 20 mEq by mouth 2 (two) times daily.  Marland Kitchen levothyroxine (SYNTHROID) 100 MCG tablet Take 1 tablet (100 mcg total) by mouth daily before  breakfast.  . MUCINEX 600 MG 12 hr tablet as needed.  Marland Kitchen telmisartan-hydrochlorothiazide (MICARDIS HCT) 80-25 MG tablet daily.   No facility-administered encounter medications on file as of 01/25/2020.   ALLERGIES: Allergies  Allergen Reactions  . Amoxicillin-Pot Clavulanate Diarrhea and Swelling    abd pain  . Other Hives and Swelling    Beta blockers Fever/aching  . Montelukast Other (See Comments)    "chest feels raw"    VACCINATION STATUS:  There is no immunization history on file for this patient.  HPI Teresa Mcgrath is 69 y.o. female who presents today with a medical history as above. 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  Thyroid/neck ultrasound on July 2019 was negative for any residual thyroid tissue no lymphadenopathy.     -According to her report, she was diagnosed with thyroid malignancy in 2015 which required total thyroidectomy.   She does not recall if she has received I-131 thyroid remnant ablation. -The details of her medical records are not available to review today. -She was given various dose of levothyroxine over the years, currently on 100 mcg p.o. every morning.  She reports compliance.    -She denies cold/heat intolerance.  She is progressively gaining weight, seady since last visit.  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 -She denies any family history of thyroid malignancy. -She denies dysphagia, odynophagia, nor voice change recently.  Review of Systems Limited as above.  Objective:    There were no vitals taken for this visit.  Wt Readings from Last 3 Encounters:  12/22/18 181 lb (82.1 kg)    Physical Exam  Musculoskeletal:  + Wheelchair-bound, due to left lower extremity amputation for bone malignancy in the remote past.    Recent Results (from the past 2160 hour(s))  TSH     Status: Abnormal   Collection Time: 01/18/20 12:00 AM  Result Value Ref Range   TSH 0.27 (A) 0.41 - 5.90    Comment: Free T4 1.28   Thyroid/neck ultrasound from May 23, 2018 reveals surgical changes of prior total thyroidectomy without evidence of residual thyroid tissue, nodules or lymphadenopathy.     Assessment & Plan:   1. History of thyroid cancer 2. Postsurgical hypothyroidism  -Her previous thyroid cancer diagnosis and treatment records are not available to review today.  Her thyroid function tests are consistent with appropriate replacement with levothyroxine.   She is advised to continue levothyroxine 100 mcg p.o. daily before breakfast.    - We discussed about the correct intake of her thyroid hormone, on empty stomach at fasting, with water, separated by at least 30 minutes from breakfast and other medications,  and separated by more than 4 hours from  calcium, iron, multivitamins, acid reflux medications (PPIs). -Patient is made aware of the fact that thyroid hormone replacement is needed for life, dose to be adjusted by periodic monitoring of thyroid function tests.    -Regarding her history of thyroid malignancy, appears that she did not receive adjuvant thyroid remnant ablation.  -Her recent surveillance thyroid/neck ultrasound reveals no residual tissue, no lymphadenopathy.  She will get another surveillance thyroid ultrasound, unstimulated thyroglobulin and thyroglobulin antibodies measurements before her next visit in  4 months.   -If she is found to have detectable thyroglobulin, she will be considered for Thyrogen stimulated whole-body scan subsequent to her next visit.   - I advised her  to maintain close follow up with Jaff, Salli Quarry, MD for primary care needs.     - Time spent on this patient care encounter:  25 minutes of which 50% was spent in  counseling and the rest reviewing  her current and  previous labs / studies and medications  doses and developing a plan for long term care. Teresa Mcgrath  participated in the discussions, expressed understanding, and voiced agreement with the above plans.  All questions were answered to her satisfaction. she is encouraged to contact clinic should she have any questions or concerns prior to her return visit.  Follow up plan: Return in about 4 months (around 05/26/2020) for Thyroid / Neck Ultrasound.   Glade Lloyd, MD [facility] [address] [place], [place] [address] Phone: [phone]  Fax: [phone]     01/25/2020, 12:32 PM  This note was partially dictated with voice recognition software. Similar sounding words can be transcribed inadequately or may not  be corrected upon review.

## 2020-02-05 ENCOUNTER — Other Ambulatory Visit: Payer: Self-pay | Admitting: "Endocrinology

## 2020-05-22 ENCOUNTER — Other Ambulatory Visit: Payer: Self-pay

## 2020-05-22 ENCOUNTER — Ambulatory Visit (HOSPITAL_COMMUNITY)
Admission: RE | Admit: 2020-05-22 | Discharge: 2020-05-22 | Disposition: A | Discharge status: Home / Self Care | Payer: Medicare Other | Source: Ambulatory Visit | Attending: "Endocrinology | Admitting: "Endocrinology

## 2020-05-22 ENCOUNTER — Other Ambulatory Visit: Payer: Self-pay | Admitting: "Endocrinology

## 2020-05-22 DIAGNOSIS — Z85850 Personal history of malignant neoplasm of thyroid: Secondary | ICD-10-CM

## 2020-05-22 LAB — TSH: TSH: 0.21 — AB (ref 0.41–5.90)

## 2020-05-23 LAB — SPECIMEN STATUS REPORT

## 2020-05-24 LAB — TSH: TSH: 0.205 u[IU]/mL — ABNORMAL LOW (ref 0.450–4.500)

## 2020-05-24 LAB — T4, FREE: Free T4: 1.76 ng/dL (ref 0.82–1.77)

## 2020-05-24 LAB — TGAB+THYROGLOBULIN IMA OR LCMS: Thyroglobulin Antibody: <1 IU/mL (ref 0.0–0.9)

## 2020-05-24 LAB — THYROGLOBULIN BY IMA: Thyroglobulin by IMA: <0.1 ng/mL — ABNORMAL LOW (ref 1.5–38.5)

## 2020-05-27 ENCOUNTER — Encounter: Payer: Self-pay | Admitting: "Endocrinology

## 2020-05-27 ENCOUNTER — Ambulatory Visit (INDEPENDENT_AMBULATORY_CARE_PROVIDER_SITE_OTHER): Payer: Medicare Other | Admitting: "Endocrinology

## 2020-05-27 ENCOUNTER — Other Ambulatory Visit: Payer: Self-pay

## 2020-05-27 VITALS — BP 143/81 | HR 79 | Ht 66.75 in | Wt 167.0 lb

## 2020-05-27 DIAGNOSIS — Z85850 Personal history of malignant neoplasm of thyroid: Secondary | ICD-10-CM | POA: Diagnosis not present

## 2020-05-27 DIAGNOSIS — E890 Postprocedural hypothyroidism: Secondary | ICD-10-CM | POA: Diagnosis not present

## 2020-05-27 MED ORDER — LEVOTHYROXINE SODIUM 88 MCG PO TABS: 88.0000 ug | ORAL_TABLET | Freq: Every day | ORAL | 1 refills | Status: DC

## 2020-05-27 NOTE — Progress Notes (Signed)
05/27/2020, 5:44 PM      Endocrinology follow-up note   Subjective:    Patient ID: Teresa Mcgrath, female    DOB: [birth year], PCP Jodean Lima, Salli Quarry, MD   Past Medical History:  Diagnosis Date  . Bone cancer ([place])   . Hypertension   . Hypothyroidism    Past Surgical History:  Procedure Laterality Date  . ABOVE KNEE LEG AMPUTATION    . NASAL SINUS SURGERY    . THYROIDECTOMY     Social History   Socioeconomic History  . Marital status: Single    Spouse name: Not on file  . Number of children: Not on file  . Years of education: Not on file  . Highest education level: Not on file  Occupational History  . Not on file  Tobacco Use  . Smoking status: Never Smoker  . Smokeless tobacco: Never Used  Vaping Use  . Vaping Use: Never used  Substance and Sexual Activity  . Alcohol use: Never  . Drug use: Never  . Sexual activity: Not on file  Other Topics Concern  . Not on file  Social History Narrative  . Not on file   Social Determinants of Health   Financial Resource Strain:   . Difficulty of Paying Living Expenses:   Food Insecurity:   . Worried About Charity fundraiser in the Last Year:   . Arboriculturist in the Last Year:   Transportation Needs:   . Film/video editor (Medical):   Marland Kitchen Lack of Transportation (Non-Medical):   Physical Activity:   . Days of Exercise per Week:   . Minutes of Exercise per Session:   Stress:   . Feeling of Stress :   Social Connections:   . Frequency of Communication with Friends and Family:   . Frequency of Social Gatherings with Friends and Family:   . Attends Religious Services:   . Active Member of Clubs or Organizations:   . Attends Archivist Meetings:   Marland Kitchen Marital Status:    Outpatient Encounter Medications as of 05/27/2020  Medication Sig  . cetirizine (ZYRTEC ALLERGY) 10 MG tablet Take 1 tablet by mouth 2 (two) times daily.  Marland Kitchen albuterol (VENTOLIN HFA)  108 (90 Base) MCG/ACT inhaler Inhale into the lungs every 6 (six) hours as needed.  Marland Kitchen amLODipine (NORVASC) 10 MG tablet Take 10 mg by mouth daily.  . carboxymethylcellulose (REFRESH PLUS) 0.5 % SOLN 1 drop 2 (two) times daily as needed.  . fluticasone (FLOVENT DISKUS) 50 MCG/BLIST diskus inhaler Inhale 1 puff into the lungs daily.  . hydrALAZINE (APRESOLINE) 10 MG tablet 3 (three) times daily before meals.  Marland Kitchen KLOR-CON M20 20 MEQ tablet Take 20 mEq by mouth 2 (two) times daily.  Marland Kitchen levothyroxine (SYNTHROID) 88 MCG tablet Take 1 tablet (88 mcg total) by mouth daily before breakfast.  . MUCINEX 600 MG 12 hr tablet as needed.  Marland Kitchen telmisartan-hydrochlorothiazide (MICARDIS HCT) 80-25 MG tablet daily.  . traZODone (DESYREL) 50 MG tablet Take 1 tablet by mouth at bedtime as needed.  . [DISCONTINUED] levothyroxine (SYNTHROID) 100 MCG tablet TAKE 1 TABLET BY MOUTH EVERY DAY BEFORE BREAKFAST   No facility-administered encounter medications on file as  of 05/27/2020.   ALLERGIES: Allergies  Allergen Reactions  . Amoxicillin-Pot Clavulanate Diarrhea and Swelling    abd pain  . Other Hives and Swelling    Beta blockers Fever/aching  . Montelukast Other (See Comments)    "chest feels raw"    VACCINATION STATUS:  There is no immunization history on file for this patient.  HPI Teresa Mcgrath is 69 y.o. female who presents today for follow up in management of her postsurgical hypothyroidism and hx of thyroid malignancy.  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   She is currently on levothyroxine 100 mcg p.o. daily before breakfast.  She reports compliance.  Her previsit labs show evidence of over replacement.  Thyroid/neck ultrasound on July 2019 was negative for any residual thyroid tissue no lymphadenopathy.  Her most recent surveillance ultrasound from July 2021 was also  unremarkable.   -According to her report, she was diagnosed with thyroid malignancy in 2015 which required total thyroidectomy.  She does not recall if she has received I-131 thyroid remnant ablation. -The details of her medical records are not available to review today.  -She denies cold/heat intolerance.  She has lost 15 pounds since last visit.  She has no new complaints today.  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 -She denies any family history of thyroid malignancy. -She denies dysphagia, odynophagia, nor voice change recently.  Review of Systems Limited as above.  Objective:    BP (!) 143/81   Pulse 79   Ht 5' 6.75" (1.695 m)   Wt 167 lb (75.8 kg)   BMI 26.35 kg/m   Wt Readings from Last 3 Encounters:  05/27/20 167 lb (75.8 kg)  12/22/18 181 lb (82.1 kg)    Physical Exam  Musculoskeletal:  + Wheelchair-bound, due to left lower extremity amputation for bone malignancy in the remote past.    Recent Results (from the past 2160 hour(s))  TSH     Status: Abnormal   Collection Time: 05/22/20 12:00 AM  Result Value Ref Range   TSH 0.21 (A) 0.41 - 5.90    Comment: FREE T4- 1.76  T4, free     Status: None   Collection Time: 05/22/20  4:27 PM  Result Value Ref Range   Free T4 1.76 0.82 - 1.77 ng/dL  TSH     Status: Abnormal   Collection Time: 05/22/20  4:27 PM  Result Value Ref Range   TSH 0.205 (L) 0.450 - 4.500 uIU/mL  TgAb+Thyroglobulin IMA or LCMS     Status: None   Collection Time: 05/22/20  4:27 PM  Result Value Ref Range   Thyroglobulin Antibody <1.0 0.0 - 0.9 IU/mL    Comment: Thyroglobulin Antibody measured by [employer]man Coulter Methodology  Specimen status report     Status: None (Preliminary result)   Collection Time: 05/22/20  4:27 PM  Result Value Ref Range   specimen status report Comment     Comment:  Ambiguous Test Order Ambiguous Test Order   Thyroglobulin by IMA     Status: Abnormal   Collection Time: 05/22/20  4:27 PM  Result Value Ref Range   Thyroglobulin by IMA <0.1 (L) 1.5 - 38.5 ng/mL    Comment: According to the [employer] of Clinical Biochemistry, the reference interval for  Thyroglobulin (TG) should be related to euthyroid patients and not for patients who underwent thyroidectomy. TG reference intervals for these patients depend on the residual mass of the thyroid tissue left after surgery. Establishing a post-operative baseline is recommended. The assay limit of quantitation is 0.1 ng/mL Thyroglobulin measured by Beckman Coulter Immunometric Assay    Thyroid/neck ultrasound from May 23, 2018 reveals surgical changes of prior total thyroidectomy without evidence of residual thyroid tissue, nodules or lymphadenopathy.     Assessment & Plan:   1. History of thyroid cancer 2. Postsurgical hypothyroidism  -Her previous thyroid cancer diagnosis and treatment records are not available to review today.  Her thyroid function tests are consistent with over replacement with levothyroxine.  I discussed and lowered her levothyroxine to 88 mcg daily before breakfast.     - We discussed about the correct intake of her thyroid hormone, on empty stomach at fasting, with water, separated by at least 30 minutes from breakfast and other medications,  and separated by more than 4 hours from calcium, iron, multivitamins, acid reflux medications (PPIs). -Patient is made aware of the fact that thyroid hormone replacement is needed for life, dose to be adjusted by periodic monitoring of thyroid function tests.   -Regarding her history of thyroid malignancy, appears that she did not receive adjuvant thyroid remnant ablation.  -Her recent surveillance thyroid/neck ultrasound reveals no residual tissue, no lymphadenopathy.  Her previsit thyroid ultrasound was negative for any residual or  recurrent thyroid tissue.  Thyroglobulin levels were undetectable on May 22, 2020.    - I advised her  to maintain close follow up with Jaff, Salli Quarry, MD for primary care needs.      - Time spent on this patient care encounter:  20 minutes of which 50% was spent in  counseling and the rest reviewing  her current and  previous labs / studies and medications  doses and developing a plan for long term care. Teresa Mcgrath  participated in the discussions, expressed understanding, and voiced agreement with the above plans.  All questions were answered to her satisfaction. she is encouraged to contact clinic should she have any questions or concerns prior to her return visit.   Follow up plan: Return in about 6 months (around 11/27/2020) for F/U with Pre-visit Labs.   Glade Lloyd, MD [facility] [address] [place], [place] [address] Phone: [phone]  Fax: [phone]     05/27/2020, 5:44 PM  This note was partially dictated with voice recognition software. Similar sounding words can be transcribed inadequately or may not  be corrected upon review.

## 2020-06-11 ENCOUNTER — Other Ambulatory Visit: Payer: Self-pay | Admitting: "Endocrinology

## 2020-08-04 ENCOUNTER — Telehealth: Payer: Self-pay | Admitting: "Endocrinology

## 2020-08-04 NOTE — Telephone Encounter (Signed)
Mailed to pt

## 2020-08-04 NOTE — Telephone Encounter (Signed)
Pt called and would like the nurse to mail her the radiologist report from her ultrasound.

## 2020-11-01 ENCOUNTER — Other Ambulatory Visit: Payer: Self-pay | Admitting: "Endocrinology

## 2020-11-27 ENCOUNTER — Ambulatory Visit: Payer: Medicare Other | Admitting: "Endocrinology

## 2020-12-09 ENCOUNTER — Encounter: Payer: Self-pay | Admitting: Adult Health

## 2020-12-09 LAB — TSH: TSH: 0.24 — AB (ref 0.41–5.90)

## 2020-12-11 ENCOUNTER — Ambulatory Visit (INDEPENDENT_AMBULATORY_CARE_PROVIDER_SITE_OTHER): Payer: Medicare Other | Admitting: "Endocrinology

## 2020-12-11 ENCOUNTER — Encounter: Payer: Self-pay | Admitting: "Endocrinology

## 2020-12-11 ENCOUNTER — Other Ambulatory Visit: Payer: Self-pay

## 2020-12-11 VITALS — BP 134/78 | HR 72 | Ht 66.0 in | Wt 173.6 lb

## 2020-12-11 DIAGNOSIS — E890 Postprocedural hypothyroidism: Secondary | ICD-10-CM

## 2020-12-11 MED ORDER — LEVOTHYROXINE SODIUM 88 MCG PO TABS: 88.0000 ug | ORAL_TABLET | Freq: Every day | ORAL | 1 refills | Status: AC

## 2020-12-11 NOTE — Progress Notes (Signed)
12/11/2020, 12:57 PM      Endocrinology follow-up note   Subjective:    Patient ID: Teresa Mcgrath, female    DOB: 18-Sep-1951, PCP Teresa Mcgrath, Teresa Quarry, MD   Past Medical History:  Diagnosis Date  . Bone cancer ([place])   . Hypertension   . Hypothyroidism    Past Surgical History:  Procedure Laterality Date  . ABOVE KNEE LEG AMPUTATION    . NASAL SINUS SURGERY    . THYROIDECTOMY     Social History   Socioeconomic History  . Marital status: Single    Spouse name: Not on file  . Number of children: Not on file  . Years of education: Not on file  . Highest education level: Not on file  Occupational History  . Not on file  Tobacco Use  . Smoking status: Never Smoker  . Smokeless tobacco: Never Used  Vaping Use  . Vaping Use: Never used  Substance and Sexual Activity  . Alcohol use: Never  . Drug use: Never  . Sexual activity: Not on file  Other Topics Concern  . Not on file  Social History Narrative  . Not on file   Social Determinants of Health   Financial Resource Strain: Not on file  Food Insecurity: Not on file  Transportation Needs: Not on file  Physical Activity: Not on file  Stress: Not on file  Social Connections: Not on file   Outpatient Encounter Medications as of 12/11/2020  Medication Sig  . albuterol (VENTOLIN HFA) 108 (90 Base) MCG/ACT inhaler Inhale into the lungs every 6 (six) hours as needed.  Marland Kitchen amLODipine (NORVASC) 10 MG tablet Take 10 mg by mouth daily.  . carboxymethylcellulose (REFRESH PLUS) 0.5 % SOLN 1 drop 2 (two) times daily as needed.  . cetirizine (ZYRTEC ALLERGY) 10 MG tablet Take 1 tablet by mouth 2 (two) times daily.  . fluticasone (FLOVENT DISKUS) 50 MCG/BLIST diskus inhaler Inhale 1 puff into the lungs daily.  . hydrALAZINE (APRESOLINE) 10 MG tablet 3 (three) times daily before meals.  Marland Kitchen KLOR-CON M20 20 MEQ tablet Take 20 mEq by mouth 2 (two) times daily.  Marland Kitchen levothyroxine  (SYNTHROID) 88 MCG tablet Take 1 tablet (88 mcg total) by mouth daily before breakfast.  . MUCINEX 600 MG 12 hr tablet as needed.  Marland Kitchen telmisartan-hydrochlorothiazide (MICARDIS HCT) 80-25 MG tablet daily.  . traZODone (DESYREL) 50 MG tablet Take 1 tablet by mouth at bedtime as needed.  . [DISCONTINUED] levothyroxine (SYNTHROID) 88 MCG tablet TAKE 1 TABLET (88 MCG TOTAL) BY MOUTH DAILY BEFORE BREAKFAST.   No facility-administered encounter medications on file as of 12/11/2020.   ALLERGIES: Allergies  Allergen Reactions  . Amoxicillin-Pot Clavulanate Diarrhea and Swelling    abd pain  . Other Hives and Swelling    Beta blockers Fever/aching  . Montelukast Other (See Comments)    "chest feels raw"    VACCINATION STATUS:  There is no immunization history on file for this patient.  HPI Teresa Mcgrath is 70 y.o. female who presents today to follow-up for management of her postsurgical hypothyroidism, and history of thyroid malignancy.   She has history of  thyroid malignancy treated in 2015 with total thyroidectomy.  No evidence  of I-131 thyroid remnant ablation in her history.  She was treated with various dose of levothyroxine, currently on 88 mcg p.o. daily before breakfast.  She reports compliance to her medication.  She returns with thyroid function test consistent with appropriate replacement.  She has no new complaints today.    Thyroid/neck ultrasound on July 2019 was negative for any residual thyroid tissue no lymphadenopathy.  Her most recent surveillance ultrasound from July 2021 was also unremarkable.   -According to her report, she was diagnosed with thyroid malignancy in 2015 which required total thyroidectomy.  She does not recall if she has received I-131 thyroid remnant ablation. -The details of her medical records are not available to review today.  -She denies cold/heat intolerance.  She has minimally fluctuating body weight, gained 6 pounds since last visit.     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 -She denies any family history of thyroid malignancy. -She denies dysphagia, odynophagia, nor voice change recently.  Review of Systems Limited as above.  Objective:    BP 134/78   Pulse 72   Ht 5\' 6"  (1.676 m)   Wt 173 lb 9.6 oz (78.7 kg)   BMI 28.02 kg/m   Wt Readings from Last 3 Encounters:  12/11/20 173 lb 9.6 oz (78.7 kg)  05/27/20 167 lb (75.8 kg)  12/22/18 181 lb (82.1 kg)    Physical Exam  Musculoskeletal:  + Wheelchair-bound, due to left lower extremity amputation for bone malignancy in the remote past.    Recent Results (from the past 2160 hour(s))  TSH     Status: Abnormal   Collection Time: 12/09/20 12:00 AM  Result Value Ref Range   TSH 0.24 (A) 0.41 - 5.90    Comment: FREE T4- 1.07   Thyroid/neck ultrasound from May 23, 2018 reveals surgical changes of prior total thyroidectomy without evidence of residual thyroid tissue, nodules or lymphadenopathy.     Assessment & Plan:   1. History of thyroid cancer 2. Postsurgical hypothyroidism  -Her previous thyroid cancer diagnosis and treatment records are not available to review today.  Her thyroid function tests are consistent with appropriate replacement.  She is advised to continue  levothyroxine  88 mcg daily before breakfast.     - We discussed about the correct intake of her thyroid hormone, on empty stomach at fasting, with water, separated by at least 30 minutes from breakfast and other medications,  and separated by more than 4 hours from calcium, iron, multivitamins, acid reflux medications (PPIs). -Patient is made aware of the fact that thyroid hormone replacement is needed for life, dose to be adjusted by periodic monitoring of thyroid function tests.   -Regarding her history of thyroid malignancy, appears that she did not  receive adjuvant thyroid remnant ablation.  -Her recent surveillance thyroid/neck ultrasound reveals no residual tissue, no lymphadenopathy.  Her previsit thyroid ultrasound was negative for any residual or recurrent thyroid tissue.  Thyroglobulin levels were undetectable on May 22, 2020.    - I advised her  to maintain close follow up with Jaff, Teresa Quarry, MD for primary care needs.      - Time spent on this patient care encounter:  20 minutes of which 50% was spent in  counseling and the rest reviewing  her current and  previous labs / studies and medications  doses and  developing a plan for long term care. Teresa Mcgrath  participated in the discussions, expressed understanding, and voiced agreement with the above plans.  All questions were answered to her satisfaction. she is encouraged to contact clinic should she have any questions or concerns prior to her return visit.   Follow up plan: Return in about 6 months (around 06/10/2021) for F/U with Pre-visit Labs.   Glade Lloyd, MD [facility] [address] [place], [place] [address] Phone: [phone]  Fax: [phone]     12/11/2020, 12:57 PM  This note was partially dictated with voice recognition software. Similar sounding words can be transcribed inadequately or may not  be corrected upon review.

## 2021-01-26 ENCOUNTER — Other Ambulatory Visit: Payer: Self-pay | Admitting: "Endocrinology

## 2021-06-16 ENCOUNTER — Ambulatory Visit: Payer: Medicare Other | Admitting: "Endocrinology

## 2021-06-18 ENCOUNTER — Ambulatory Visit: Payer: Medicare Other | Admitting: "Endocrinology

## 2022-01-18 ENCOUNTER — Other Ambulatory Visit: Payer: Self-pay | Admitting: "Endocrinology

## 2022-03-20 IMAGING — US US THYROID
1 series · 14 of 24 positions shown · non-contrast
Comparison: 05/23/2018

CLINICAL DATA: Other.  History of thyroidectomy in 5155.

EXAM:
THYROID ULTRASOUND
TECHNIQUE: Ultrasound examination of the thyroid gland and adjacent soft
tissues was performed.

[Series 1: us thyroid · 0.06mm/px · 14 of 24 slices shown]
[im 1/24]
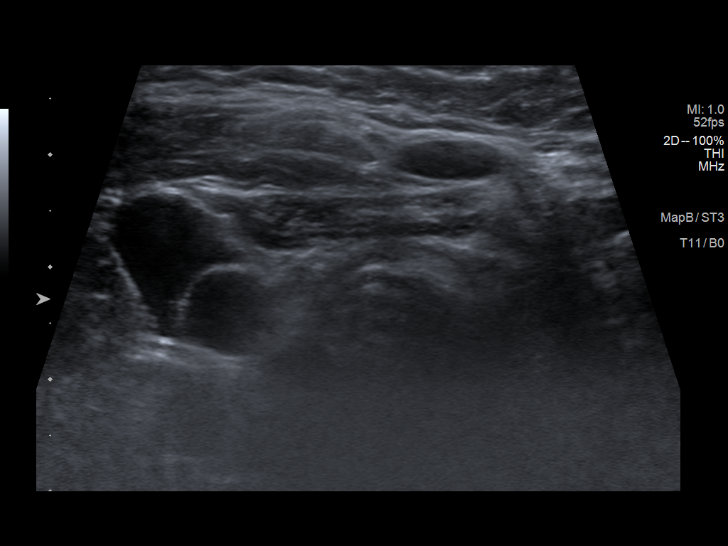
[im 3/24]
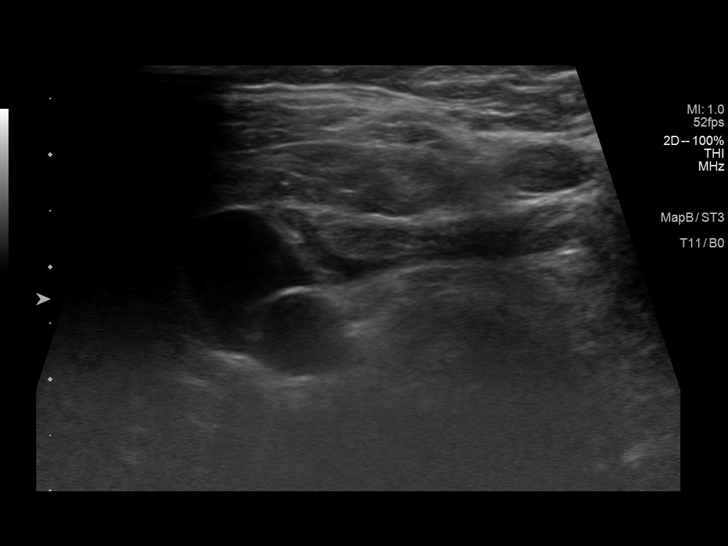
[im 5/24]
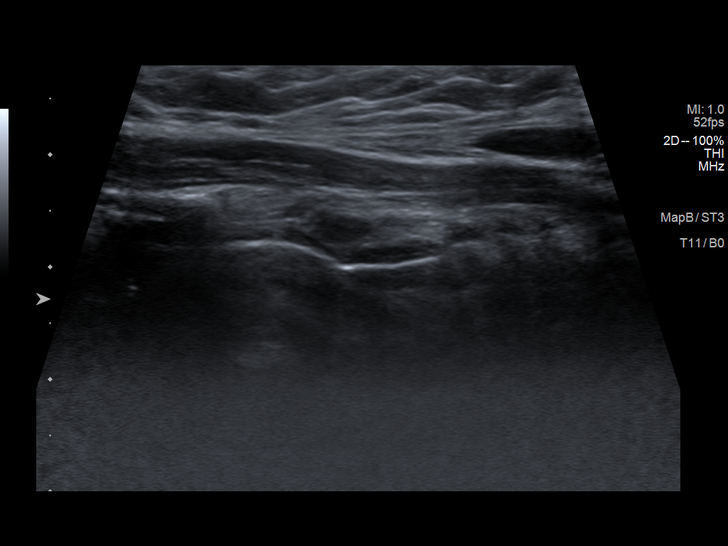
[im 7/24]
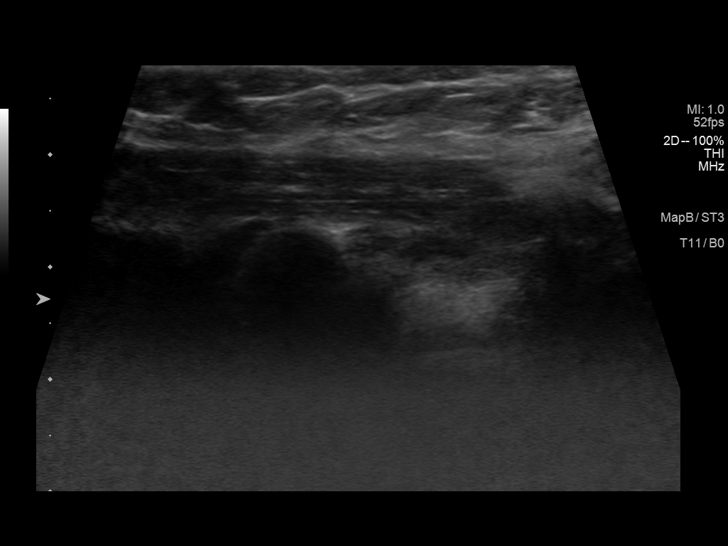
[im 8/24]
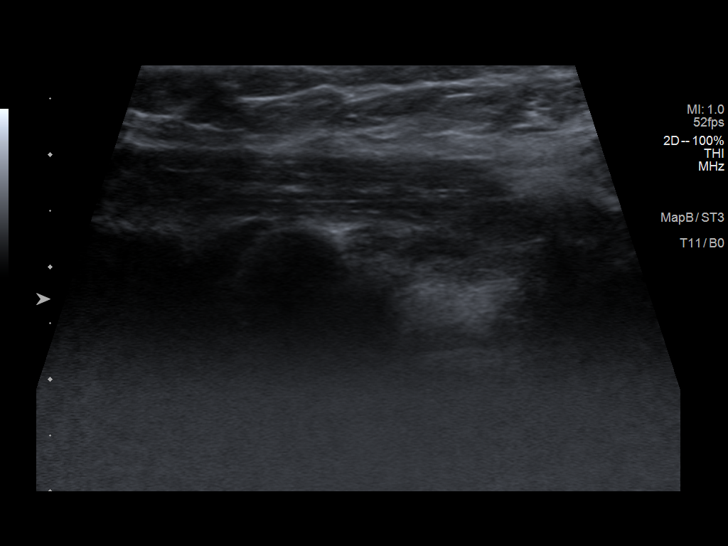
[im 10/24]
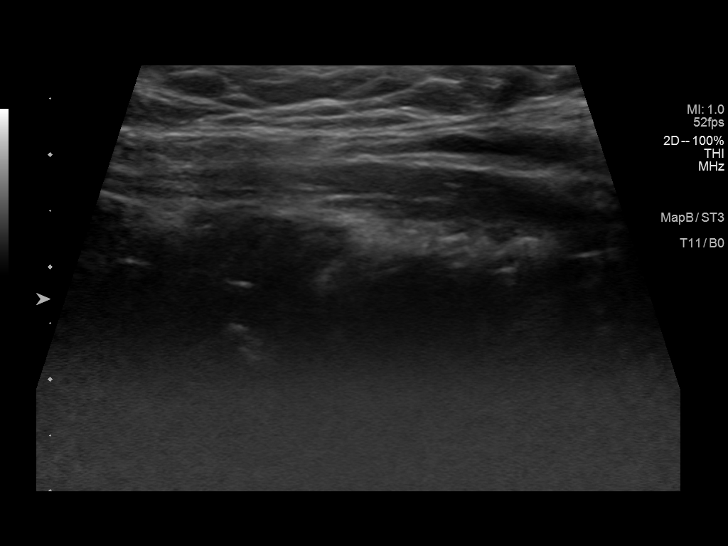
[im 12/24]
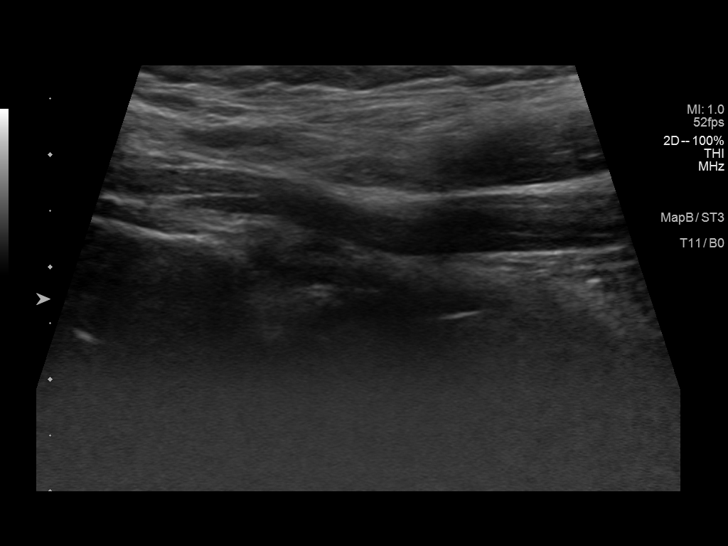
[im 13/24]
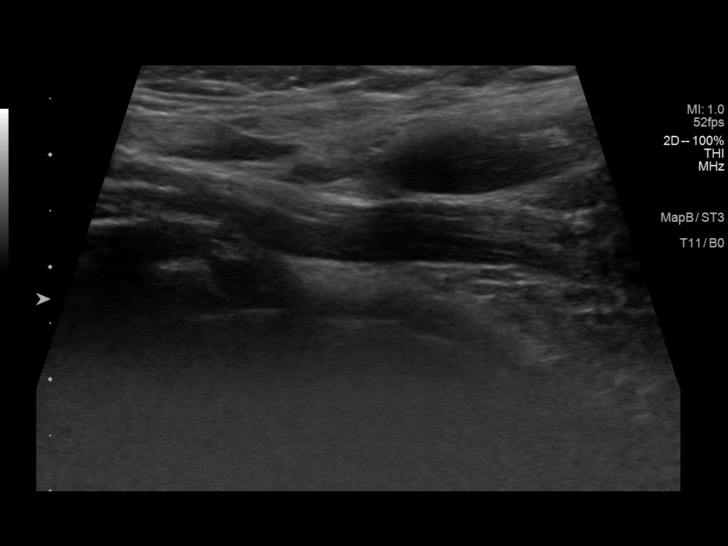
[im 15/24]
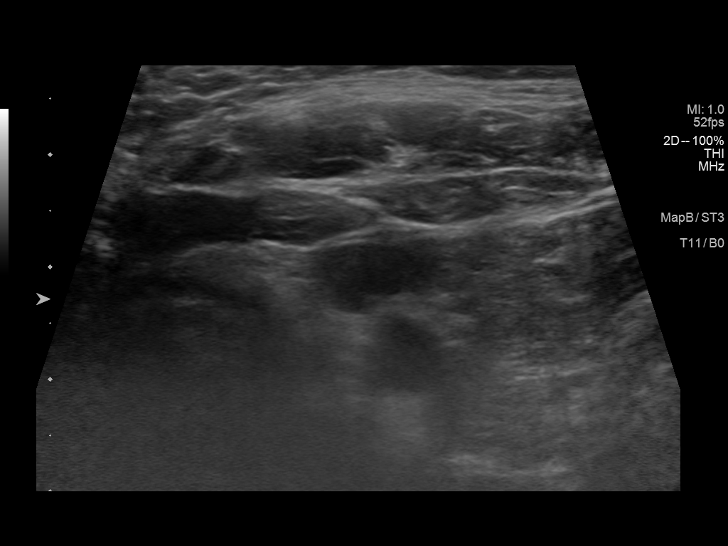
[im 17/24]
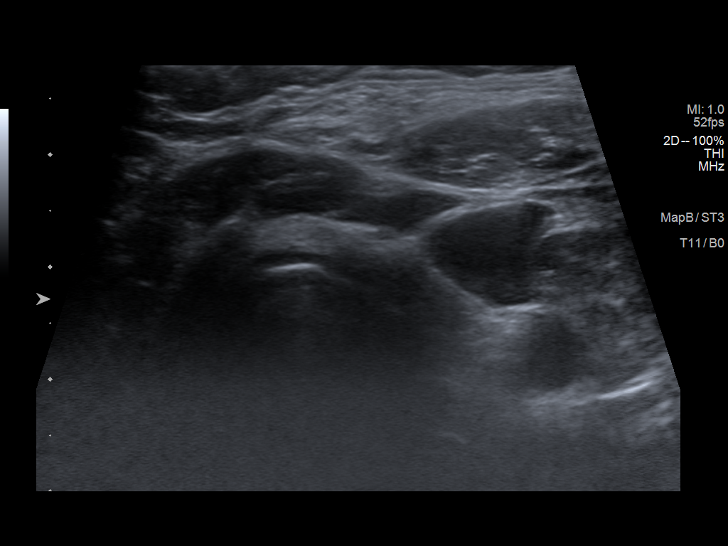
[im 19/24]
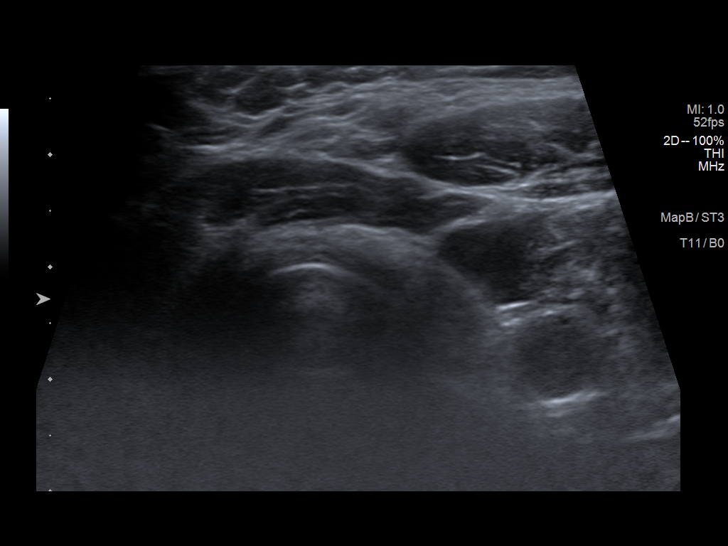
[im 20/24]
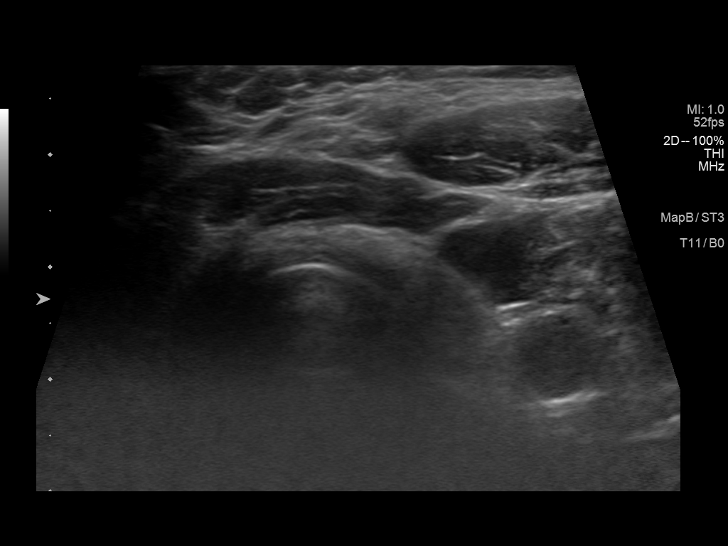
[im 22/24]
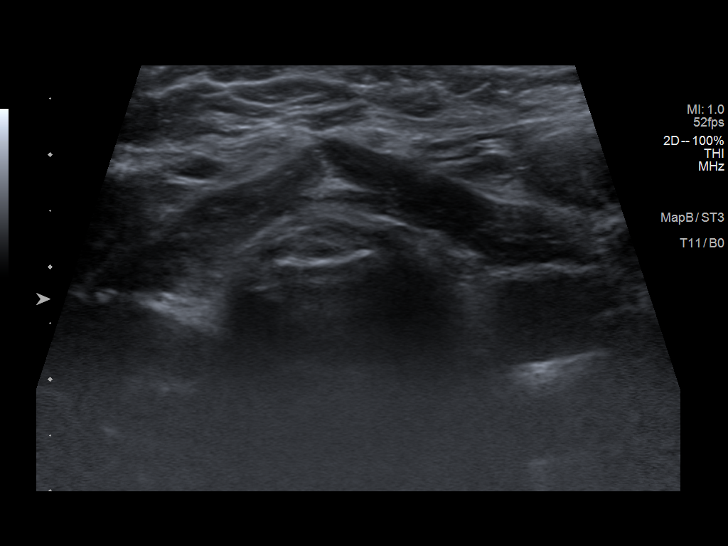
[im 24/24]
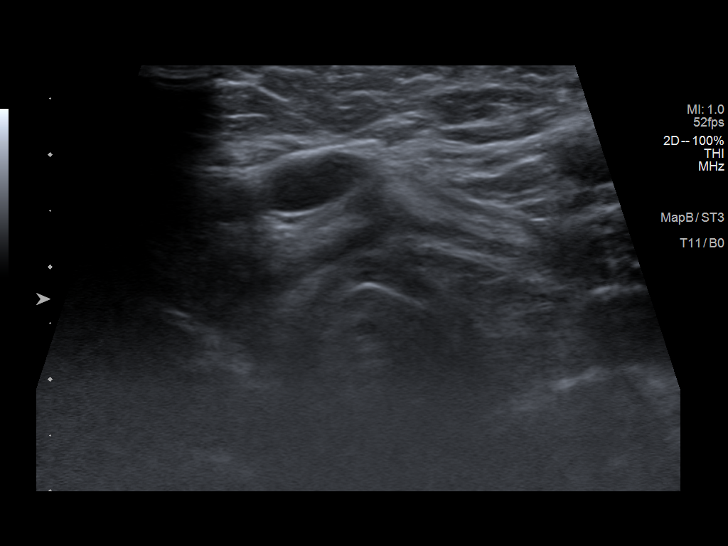

[14 of 24 positions shown; findings below may reference images not displayed]

FINDINGS: Isthmus: Surgically absent. There is no residual nodular soft tissue
within the isthmic resection bed.

Right lobe: Surgically absent. There is no residual nodular soft
tissue within the right lobectomy resection bed.

Left lobe: Surgically absent. There is no residual nodular soft
tissue within the left lobectomy resection bed.

_________________________________________________________

No regional cervical lymphadenopathy.
IMPRESSION: Post total thyroidectomy without evidence of residual or locally
recurrent disease.

## 2023-09-19 ENCOUNTER — Telehealth: Payer: Self-pay | Admitting: "Endocrinology

## 2023-09-19 ENCOUNTER — Other Ambulatory Visit: Payer: Self-pay | Admitting: "Endocrinology

## 2023-09-19 DIAGNOSIS — Z85850 Personal history of malignant neoplasm of thyroid: Secondary | ICD-10-CM

## 2023-09-19 DIAGNOSIS — E890 Postprocedural hypothyroidism: Secondary | ICD-10-CM

## 2023-09-19 NOTE — Telephone Encounter (Signed)
Pt's PCP would like her to get back in, she did some recent labs under media, could you use that to see her or do you want additional labs?
